# Patient Record
Sex: Female | Born: 1970 | Race: White | Hispanic: No | Smoking: Former smoker
Health system: Southern US, Community
[De-identification: ages and names within clinical notes are randomized; demographics above are authoritative.]

## PROBLEM LIST (undated history)

## (undated) DIAGNOSIS — F32A Depression, unspecified: Secondary | ICD-10-CM

## (undated) DIAGNOSIS — R0602 Shortness of breath: Secondary | ICD-10-CM

## (undated) DIAGNOSIS — J349 Unspecified disorder of nose and nasal sinuses: Secondary | ICD-10-CM

## (undated) DIAGNOSIS — J189 Pneumonia, unspecified organism: Secondary | ICD-10-CM

## (undated) DIAGNOSIS — R590 Localized enlarged lymph nodes: Secondary | ICD-10-CM

## (undated) DIAGNOSIS — G478 Other sleep disorders: Secondary | ICD-10-CM

## (undated) DIAGNOSIS — G4700 Insomnia, unspecified: Secondary | ICD-10-CM

## (undated) DIAGNOSIS — E785 Hyperlipidemia, unspecified: Secondary | ICD-10-CM

## (undated) DIAGNOSIS — N200 Calculus of kidney: Secondary | ICD-10-CM

## (undated) DIAGNOSIS — C801 Malignant (primary) neoplasm, unspecified: Secondary | ICD-10-CM

## (undated) DIAGNOSIS — R51 Headache: Secondary | ICD-10-CM

## (undated) DIAGNOSIS — E079 Disorder of thyroid, unspecified: Secondary | ICD-10-CM

## (undated) DIAGNOSIS — R599 Enlarged lymph nodes, unspecified: Secondary | ICD-10-CM

## (undated) DIAGNOSIS — G932 Benign intracranial hypertension: Secondary | ICD-10-CM

## (undated) DIAGNOSIS — J40 Bronchitis, not specified as acute or chronic: Secondary | ICD-10-CM

## (undated) DIAGNOSIS — R011 Cardiac murmur, unspecified: Secondary | ICD-10-CM

## (undated) DIAGNOSIS — R519 Headache, unspecified: Secondary | ICD-10-CM

## (undated) DIAGNOSIS — R0683 Snoring: Secondary | ICD-10-CM

## (undated) DIAGNOSIS — R351 Nocturia: Secondary | ICD-10-CM

## (undated) DIAGNOSIS — R0681 Apnea, not elsewhere classified: Secondary | ICD-10-CM

## (undated) DIAGNOSIS — F329 Major depressive disorder, single episode, unspecified: Secondary | ICD-10-CM

## (undated) HISTORY — DX: Bronchitis, not specified as acute or chronic: J40

## (undated) HISTORY — DX: Apnea, not elsewhere classified: R06.81

## (undated) HISTORY — PX: CHOLECYSTECTOMY: SHX55

## (undated) HISTORY — DX: Headache: R51

## (undated) HISTORY — DX: Insomnia, unspecified: G47.00

## (undated) HISTORY — PX: OTHER SURGICAL HISTORY: SHX169

## (undated) HISTORY — DX: Enlarged lymph nodes, unspecified: R59.9

## (undated) HISTORY — DX: Major depressive disorder, single episode, unspecified: F32.9

## (undated) HISTORY — DX: Pneumonia, unspecified organism: J18.9

## (undated) HISTORY — DX: Nocturia: R35.1

## (undated) HISTORY — DX: Snoring: R06.83

## (undated) HISTORY — DX: Disorder of thyroid, unspecified: E07.9

## (undated) HISTORY — DX: Benign intracranial hypertension: G93.2

## (undated) HISTORY — DX: Other sleep disorders: G47.8

## (undated) HISTORY — DX: Localized enlarged lymph nodes: R59.0

## (undated) HISTORY — DX: Morbid (severe) obesity due to excess calories: E66.01

## (undated) HISTORY — DX: Malignant (primary) neoplasm, unspecified: C80.1

## (undated) HISTORY — DX: Shortness of breath: R06.02

## (undated) HISTORY — DX: Depression, unspecified: F32.A

## (undated) HISTORY — DX: Headache, unspecified: R51.9

## (undated) HISTORY — DX: Cardiac murmur, unspecified: R01.1

## (undated) HISTORY — DX: Calculus of kidney: N20.0

## (undated) HISTORY — PX: TUBAL LIGATION: SHX77

## (undated) HISTORY — DX: Hyperlipidemia, unspecified: E78.5

## (undated) HISTORY — DX: Unspecified disorder of nose and nasal sinuses: J34.9

---

## 1996-04-11 HISTORY — PX: HERNIA REPAIR: SHX51

## 1997-09-17 ENCOUNTER — Other Ambulatory Visit: Admission: RE | Admit: 1997-09-17 | Discharge: 1997-09-17 | Payer: Self-pay | Admitting: Obstetrics and Gynecology

## 1997-10-14 ENCOUNTER — Other Ambulatory Visit: Admission: RE | Admit: 1997-10-14 | Discharge: 1997-10-14 | Payer: Self-pay | Admitting: Obstetrics and Gynecology

## 1998-05-18 ENCOUNTER — Other Ambulatory Visit: Admission: RE | Admit: 1998-05-18 | Discharge: 1998-05-18 | Payer: Self-pay | Admitting: Obstetrics and Gynecology

## 1998-07-03 ENCOUNTER — Other Ambulatory Visit: Admission: RE | Admit: 1998-07-03 | Discharge: 1998-07-03 | Payer: Self-pay | Admitting: Obstetrics and Gynecology

## 1998-07-24 ENCOUNTER — Emergency Department (HOSPITAL_COMMUNITY): Admission: EM | Admit: 1998-07-24 | Discharge: 1998-07-25 | Payer: Self-pay

## 1998-07-26 ENCOUNTER — Ambulatory Visit (HOSPITAL_COMMUNITY): Admission: RE | Admit: 1998-07-26 | Discharge: 1998-07-26 | Payer: Self-pay | Admitting: Emergency Medicine

## 1998-07-26 ENCOUNTER — Encounter: Payer: Self-pay | Admitting: Emergency Medicine

## 1998-08-07 ENCOUNTER — Observation Stay (HOSPITAL_COMMUNITY): Admission: RE | Admit: 1998-08-07 | Discharge: 1998-08-08 | Payer: Self-pay | Admitting: *Deleted

## 1998-08-07 ENCOUNTER — Encounter: Payer: Self-pay | Admitting: *Deleted

## 1998-12-11 ENCOUNTER — Other Ambulatory Visit: Admission: RE | Admit: 1998-12-11 | Discharge: 1998-12-11 | Payer: Self-pay | Admitting: Obstetrics and Gynecology

## 1999-05-03 ENCOUNTER — Other Ambulatory Visit: Admission: RE | Admit: 1999-05-03 | Discharge: 1999-05-03 | Payer: Self-pay | Admitting: Obstetrics and Gynecology

## 1999-08-25 ENCOUNTER — Other Ambulatory Visit: Admission: RE | Admit: 1999-08-25 | Discharge: 1999-08-25 | Payer: Self-pay | Admitting: Obstetrics and Gynecology

## 1999-08-25 ENCOUNTER — Encounter (INDEPENDENT_AMBULATORY_CARE_PROVIDER_SITE_OTHER): Payer: Self-pay

## 1999-10-20 ENCOUNTER — Ambulatory Visit: Admission: RE | Admit: 1999-10-20 | Discharge: 1999-10-20 | Payer: Self-pay | Admitting: Obstetrics and Gynecology

## 1999-12-07 ENCOUNTER — Encounter: Admission: RE | Admit: 1999-12-07 | Discharge: 2000-03-06 | Payer: Self-pay | Admitting: Obstetrics and Gynecology

## 2000-01-28 ENCOUNTER — Ambulatory Visit (HOSPITAL_COMMUNITY): Admission: RE | Admit: 2000-01-28 | Discharge: 2000-01-28 | Payer: Self-pay | Admitting: Obstetrics and Gynecology

## 2000-01-30 ENCOUNTER — Encounter (INDEPENDENT_AMBULATORY_CARE_PROVIDER_SITE_OTHER): Payer: Self-pay | Admitting: Specialist

## 2000-01-30 ENCOUNTER — Inpatient Hospital Stay (HOSPITAL_COMMUNITY): Admission: AD | Admit: 2000-01-30 | Discharge: 2000-02-04 | Payer: Self-pay | Admitting: Obstetrics and Gynecology

## 2000-02-02 ENCOUNTER — Encounter: Payer: Self-pay | Admitting: Obstetrics and Gynecology

## 2000-05-25 ENCOUNTER — Other Ambulatory Visit: Admission: RE | Admit: 2000-05-25 | Discharge: 2000-05-25 | Payer: Self-pay | Admitting: Obstetrics and Gynecology

## 2000-05-26 ENCOUNTER — Encounter (INDEPENDENT_AMBULATORY_CARE_PROVIDER_SITE_OTHER): Payer: Self-pay

## 2000-05-26 ENCOUNTER — Other Ambulatory Visit: Admission: RE | Admit: 2000-05-26 | Discharge: 2000-05-26 | Payer: Self-pay | Admitting: Obstetrics and Gynecology

## 2001-03-22 ENCOUNTER — Other Ambulatory Visit: Admission: RE | Admit: 2001-03-22 | Discharge: 2001-03-22 | Payer: Self-pay | Admitting: Obstetrics and Gynecology

## 2001-04-11 HISTORY — PX: THYROIDECTOMY: SHX17

## 2001-06-12 ENCOUNTER — Other Ambulatory Visit: Admission: RE | Admit: 2001-06-12 | Discharge: 2001-06-12 | Payer: Self-pay | Admitting: Radiology

## 2001-08-02 ENCOUNTER — Encounter: Payer: Self-pay | Admitting: Surgery

## 2001-08-06 ENCOUNTER — Encounter (INDEPENDENT_AMBULATORY_CARE_PROVIDER_SITE_OTHER): Payer: Self-pay | Admitting: Specialist

## 2001-08-06 ENCOUNTER — Observation Stay (HOSPITAL_COMMUNITY): Admission: RE | Admit: 2001-08-06 | Discharge: 2001-08-07 | Payer: Self-pay | Admitting: Surgery

## 2001-09-04 ENCOUNTER — Ambulatory Visit (HOSPITAL_COMMUNITY): Admission: RE | Admit: 2001-09-04 | Discharge: 2001-09-04 | Payer: Self-pay | Admitting: Endocrinology

## 2001-09-04 ENCOUNTER — Encounter: Payer: Self-pay | Admitting: Endocrinology

## 2001-09-14 ENCOUNTER — Ambulatory Visit (HOSPITAL_COMMUNITY): Admission: RE | Admit: 2001-09-14 | Discharge: 2001-09-14 | Payer: Self-pay | Admitting: Endocrinology

## 2001-09-14 ENCOUNTER — Encounter: Payer: Self-pay | Admitting: Endocrinology

## 2001-10-03 ENCOUNTER — Other Ambulatory Visit: Admission: RE | Admit: 2001-10-03 | Discharge: 2001-10-03 | Payer: Self-pay | Admitting: Obstetrics and Gynecology

## 2002-03-25 ENCOUNTER — Other Ambulatory Visit: Admission: RE | Admit: 2002-03-25 | Discharge: 2002-03-25 | Payer: Self-pay | Admitting: Obstetrics and Gynecology

## 2003-01-26 ENCOUNTER — Encounter: Payer: Self-pay | Admitting: Emergency Medicine

## 2003-01-26 ENCOUNTER — Emergency Department (HOSPITAL_COMMUNITY): Admission: EM | Admit: 2003-01-26 | Discharge: 2003-01-26 | Payer: Self-pay | Admitting: Emergency Medicine

## 2003-09-22 ENCOUNTER — Other Ambulatory Visit: Admission: RE | Admit: 2003-09-22 | Discharge: 2003-09-22 | Payer: Self-pay | Admitting: Obstetrics and Gynecology

## 2004-06-29 ENCOUNTER — Emergency Department (HOSPITAL_COMMUNITY): Admission: EM | Admit: 2004-06-29 | Discharge: 2004-06-29 | Payer: Self-pay | Admitting: Emergency Medicine

## 2004-06-29 ENCOUNTER — Encounter (INDEPENDENT_AMBULATORY_CARE_PROVIDER_SITE_OTHER): Payer: Self-pay | Admitting: Specialist

## 2004-06-29 ENCOUNTER — Ambulatory Visit (HOSPITAL_COMMUNITY): Admission: RE | Admit: 2004-06-29 | Discharge: 2004-06-29 | Payer: Self-pay | Admitting: Obstetrics and Gynecology

## 2004-10-21 ENCOUNTER — Other Ambulatory Visit: Admission: RE | Admit: 2004-10-21 | Discharge: 2004-10-21 | Payer: Self-pay | Admitting: Obstetrics and Gynecology

## 2005-05-20 ENCOUNTER — Ambulatory Visit (HOSPITAL_COMMUNITY): Admission: RE | Admit: 2005-05-20 | Discharge: 2005-05-20 | Payer: Self-pay | Admitting: Neurology

## 2005-05-25 ENCOUNTER — Encounter: Admission: RE | Admit: 2005-05-25 | Discharge: 2005-05-25 | Payer: Self-pay | Admitting: Neurology

## 2005-08-01 ENCOUNTER — Ambulatory Visit (HOSPITAL_COMMUNITY): Admission: RE | Admit: 2005-08-01 | Discharge: 2005-08-01 | Payer: Self-pay | Admitting: Neurology

## 2005-09-26 ENCOUNTER — Emergency Department (HOSPITAL_COMMUNITY): Admission: EM | Admit: 2005-09-26 | Discharge: 2005-09-27 | Payer: Self-pay | Admitting: Emergency Medicine

## 2005-10-07 ENCOUNTER — Encounter: Admission: RE | Admit: 2005-10-07 | Discharge: 2005-10-07 | Payer: Self-pay | Admitting: Obstetrics and Gynecology

## 2006-02-24 ENCOUNTER — Encounter (INDEPENDENT_AMBULATORY_CARE_PROVIDER_SITE_OTHER): Payer: Self-pay | Admitting: *Deleted

## 2006-02-24 ENCOUNTER — Ambulatory Visit (HOSPITAL_COMMUNITY): Admission: RE | Admit: 2006-02-24 | Discharge: 2006-02-24 | Payer: Self-pay | Admitting: Obstetrics and Gynecology

## 2006-02-24 ENCOUNTER — Encounter (INDEPENDENT_AMBULATORY_CARE_PROVIDER_SITE_OTHER): Payer: Self-pay | Admitting: Specialist

## 2008-09-22 ENCOUNTER — Encounter (HOSPITAL_COMMUNITY): Admission: RE | Admit: 2008-09-22 | Discharge: 2008-12-21 | Payer: Self-pay | Admitting: Surgery

## 2008-10-01 ENCOUNTER — Encounter: Admission: RE | Admit: 2008-10-01 | Discharge: 2008-10-01 | Payer: Self-pay | Admitting: Surgery

## 2010-01-04 ENCOUNTER — Encounter (HOSPITAL_COMMUNITY)
Admission: RE | Admit: 2010-01-04 | Discharge: 2010-04-04 | Payer: Self-pay | Source: Home / Self Care | Attending: Endocrinology | Admitting: Endocrinology

## 2010-05-02 ENCOUNTER — Encounter: Payer: Self-pay | Admitting: Neurology

## 2010-05-03 ENCOUNTER — Encounter: Payer: Self-pay | Admitting: Surgery

## 2010-06-24 LAB — THYROGLOBULIN LEVEL: Thyroglobulin: 0.4 ng/mL (ref 0.0–55.0)

## 2010-07-12 ENCOUNTER — Other Ambulatory Visit: Payer: Self-pay | Admitting: Obstetrics and Gynecology

## 2010-07-12 DIAGNOSIS — N649 Disorder of breast, unspecified: Secondary | ICD-10-CM

## 2010-07-14 ENCOUNTER — Other Ambulatory Visit: Payer: Self-pay

## 2010-07-19 LAB — THYROGLOBULIN LEVEL: Thyroglobulin: 0.9 ng/mL — ABNORMAL LOW (ref 2.0–35.0)

## 2010-07-27 ENCOUNTER — Ambulatory Visit
Admission: RE | Admit: 2010-07-27 | Discharge: 2010-07-27 | Disposition: A | Discharge status: Home / Self Care | Payer: 59 | Source: Ambulatory Visit | Attending: Obstetrics and Gynecology | Admitting: Obstetrics and Gynecology

## 2010-07-27 DIAGNOSIS — N649 Disorder of breast, unspecified: Secondary | ICD-10-CM

## 2010-08-16 ENCOUNTER — Ambulatory Visit
Admission: RE | Admit: 2010-08-16 | Discharge: 2010-08-16 | Disposition: A | Discharge status: Home / Self Care | Payer: 59 | Source: Ambulatory Visit | Attending: Surgery | Admitting: Surgery

## 2010-08-16 ENCOUNTER — Other Ambulatory Visit: Payer: Self-pay | Admitting: Surgery

## 2010-08-16 DIAGNOSIS — R1031 Right lower quadrant pain: Secondary | ICD-10-CM

## 2010-08-16 MED ORDER — IOHEXOL 300 MG/ML  SOLN
125.0000 mL | Freq: Once | INTRAMUSCULAR | Status: AC | PRN
  Administered 2010-08-16: 125 mL via INTRAVENOUS

## 2010-08-25 ENCOUNTER — Other Ambulatory Visit: Payer: Self-pay | Admitting: Obstetrics and Gynecology

## 2010-08-27 ENCOUNTER — Other Ambulatory Visit (INDEPENDENT_AMBULATORY_CARE_PROVIDER_SITE_OTHER): Payer: Self-pay | Admitting: Surgery

## 2010-08-27 ENCOUNTER — Ambulatory Visit (HOSPITAL_BASED_OUTPATIENT_CLINIC_OR_DEPARTMENT_OTHER)
Admission: RE | Admit: 2010-08-27 | Discharge: 2010-08-27 | Disposition: A | Discharge status: Home / Self Care | Payer: 59 | Source: Ambulatory Visit | Attending: Surgery | Admitting: Surgery

## 2010-08-27 DIAGNOSIS — R599 Enlarged lymph nodes, unspecified: Secondary | ICD-10-CM | POA: Insufficient documentation

## 2010-08-27 DIAGNOSIS — E669 Obesity, unspecified: Secondary | ICD-10-CM | POA: Insufficient documentation

## 2010-08-27 DIAGNOSIS — F17200 Nicotine dependence, unspecified, uncomplicated: Secondary | ICD-10-CM | POA: Insufficient documentation

## 2010-08-27 DIAGNOSIS — J45909 Unspecified asthma, uncomplicated: Secondary | ICD-10-CM | POA: Insufficient documentation

## 2010-08-27 DIAGNOSIS — G43909 Migraine, unspecified, not intractable, without status migrainosus: Secondary | ICD-10-CM | POA: Insufficient documentation

## 2010-08-27 DIAGNOSIS — F341 Dysthymic disorder: Secondary | ICD-10-CM | POA: Insufficient documentation

## 2010-08-27 DIAGNOSIS — Z01812 Encounter for preprocedural laboratory examination: Secondary | ICD-10-CM | POA: Insufficient documentation

## 2010-08-27 DIAGNOSIS — Z85850 Personal history of malignant neoplasm of thyroid: Secondary | ICD-10-CM | POA: Insufficient documentation

## 2010-08-27 LAB — POCT HEMOGLOBIN-HEMACUE: Hemoglobin: 14.6 g/dL (ref 12.0–15.0)

## 2010-08-27 NOTE — Op Note (Signed)
[facility]  Patient:    Michelle Green, Michelle Green                      MRN: [id number] Proc. Date: 01/30/00 Adm. Date:  [id number] Attending:  Trevor Iha                           Operative Report  PREOPERATIVE DIAGNOSES:       1. Previous cesarean section for repeat                                  cesarean section.                               2. Mature amniocentesis.                               3. Gestational diabetes requiring insulin.                               4. Labor with rupture of membranes and meconium                                  stained fluid.  POSTOPERATIVE DIAGNOSES:      1. Previous cesarean section for repeat                                  cesarean section.                               2. Mature amniocentesis.                               3. Gestational diabetes requiring insulin.                               4. Labor with rupture of membranes and meconium                                  stained fluid.  OPERATION:                    Repeat low segment transverse cesarean section.  SURGEON:                      Trevor Iha, M.D.  ASSISTANT:  ANESTHESIA:                   Spinal anesthesia.  ESTIMATED BLOOD LOSS:         800 cc.  INDICATIONS:                  Ms. Michelle Green is a 40 year old G2, P1 at 37-1/2 weeks who had an amniocentesis two days ago which showed fetal lung maturity. She is a gestational diabetic with good control requiring insulin.  She presents in active labor.  Cervix 6 cm and gross rupture of fluid.  Meconium stained fluid.  Previous cesarean section for repeat.  PLAN:                         Repeat cesarean section.  Risks and benefits were discussed and informed consent was obtained.  FINDINGS:                     A viable female infant with Apgars of 9 and 9, meconium fluid, none below the vocal cords per anesthesia.  The pH arterial was 7.29.  DESCRIPTION OF PROCEDURE:     After  adequate analgesia, the patient was placed in the supine position with left lateral tilt.  She was sterilely prepped and draped.  Foley catheter was sterilely in place and a Pfannenstiel skin incision was made two fingerbreadth above the pubic symphysis.  This was taken down sharply to the fascia which was incised transversely and extended superior and inferiorly off the bellies of the rectus muscle.  The peritoneum was entered sharply. Bladder blade was placed. The uterine serosa was elevated and nicked and incised transversely.  Bladder flap was created and placed behind the bladder blade.  A low segment myotomy incision was made down to the infants vertex.  It was extended laterally with the operators fingertips. The infants vertex was delivered atraumatically.  Nares and pharynx were then DeLee suctioned, systemically suctioned with minimal meconium fluid return. Nuchal cord x 1 was reduced.  The infant was delivered.  Cord was clamped and the infant was handed to the pediatrician.  Cord blood was then obtained. Placenta was extracted manually.  Uterus was exteriorized and wiped clean with a dry lap.  The myotomy incision was closed in one layer with a running locking layer of 0 Monocryl. Good hemostasis was achieved.  Normal uterus, tubes and ovaries were noted.  The uterus was placed back into the peritoneal cavity after copious amount of irrigation.  Adequate hemostasis was assured. The peritoneum was closed with 0 Monocryl and rectus muscles plicated in the midline.  Irrigation was applied and after adequate hemostasis, the fascia was closed with 0 Panacryl with good approximation.  Irrigation of skin applied and after adequate hemostasis, skin was stapled and Steri-Strips were applied. The patient received 1 g of Cefotetan after delivery of the placenta.  Sponge, needle and instrument counts were normal x 3.  The estimated blood loss was 800 cc. DD:  01/30/00 TD:  01/31/00 Job:  [id number]

## 2010-08-27 NOTE — H&P (Signed)
NAME:  Michelle Green, Michelle Green               ACCOUNT NO.:  [id number]   MEDICAL RECORD NO.:  [id number]          PATIENT TYPE:  AMB   LOCATION:  SDC                           FACILITY:  [facility]   PHYSICIAN:  Guy Sandifer. Henderson Cloud, M.D. DATE OF BIRTH:  1970-05-15   DATE OF ADMISSION:  02/24/2006  DATE OF DISCHARGE:                                HISTORY & PHYSICAL   CHIEF COMPLAINT:  Pelvic pain and irregular menses.   HISTORY OF PRESENT ILLNESS:  This patient is a 40 year old married white  female, G2, P2, status post tubal ligation, endometrial ablation, and  ablation of endometriosis in March, 2006.  She has had a return of right  lower quadrant pain, especially with her menses becoming increasingly worse.  After discussion of the options, she is being admitted for laparoscopy,  hysteroscopy, D&C.   PAST MEDICAL HISTORY:  1. Hyperlipidemia.  2. Asthma.  3. Hypothyroidism.  4. Depression.  5. Pseudotumor cerebri.   PAST SURGICAL HISTORY:  1. Laparoscopic cholecystectomy.  2. Hernia rupture in a Pfannenstiel incision.  3. Thyroidectomy.  4. Laparoscopy, hysteroscopy, D&C, ablation, tubal ligation, as above.  5. Lumbar puncture x2.   OBSTETRIC HISTORY:  Cesarean section x2.   SOCIAL HISTORY:  Consumes alcohol on a social basis.  Denies drug or tobacco  abuse.   FAMILY HISTORY:  Unknown.   MEDICATIONS:  1. Topamax 50 mg a day x2.  2. Synthroid 112 mcg a day x2.  3. Advair 100/50 a day x2.  4. Tricor 145 mcg a day.  5. Omnicor 4 a day.   ALLERGIES:  PENICILLIN, leading to hives.   REVIEW OF SYSTEMS:  Pseudotumor cerebri, as above.  CARDIAC:  No chest pain.  PULMONARY: Denies shortness of breath.  GI:  Denies recent change in bowel  habits.   PHYSICAL EXAMINATION:  VITAL SIGNS:  Height 5 feet 4 inches.  Weight 219  pounds.  Blood pressure 122/70.  HEENT:  Without thyromegaly.  LUNGS:  Clear to auscultation.  HEART:  Regular rate and rhythm.  BACK:  Without CVA tenderness.  BREASTS:  Without mass, __________, or discharge.  ABDOMEN:  Soft and nontender without masses.  PELVIC:  Vulva, vagina, and cervix without lesion.  Uterus normal in size,  mobile, nontender.  Adnexa nontender without palpable masses.  EXTREMITIES:  Grossly within normal limits.  NEUROLOGIC:  Grossly within normal limits.   ASSESSMENT:  1. Pelvic pain.  2. Irregular menses.   PLAN:  Laparoscopy, hysteroscopy, D&C.      Guy Sandifer Henderson Cloud, M.D.  Electronically Signed     JET/MEDQ  D:  02/22/2006  T:  02/22/2006  Job:  [id number]

## 2010-08-27 NOTE — Discharge Summary (Signed)
[facility]  Patient:    Michelle Green, Michelle Green                      MRN: [id number] Adm. Date:  [id number] Disc. Date: [id number] Attending:  Trevor Iha Dictator:   Danie Chandler, R.N.                           Discharge Summary  ADMISSION DIAGNOSES:          1. Previous cesarean section for repeat                                  cesarean section.                               2. Mature amniocentesis.                               3. Gestational diabetes requiring insulin.                               4. Labor with rupture of membranes and meconium                                  stained fluid.  DISCHARGE DIAGNOSES:          1. Previous cesarean section for repeat                                  cesarean section.                               2. Mature amniocentesis.                               3. Gestational diabetes requiring insulin.                               4. Labor with rupture of membranes and meconium                                  stained fluid.  PROCEDURE:                    On January 30, 2000, repeat low segment transverse cesarean section was performed.  REASON FOR ADMISSION:         Please see dictated H&P.  HOSPITAL COURSE:              The patient was taken to the operating room and underwent the above named procedure without complications.  This was productive of a viable female infant with Apgars of 9 at one minute and 9 at five minutes.  An arterial cord pH was 7.29.  Postoperatively, on day #1, the patient had good control of pain and good return of bowel function.  She was  tolerating a regular diet. Her hemoglobin was 10.0, hematocrit 28.5 and white blood cell count 14.1.  On postoperative day #2, the patients blood pressures were 132/82 and 136/74.  She was without headache, visual change or right upper quadrant pain.  On postoperative day #3, the patient was complaining of some chest pain and upper back pain  when breathing in.  She denied headache, visual change or right upper quadrant pain.  Her capillary blood glucoses were good.  Blood pressures were 130s to 140s/70s to 80s.  Deep tendon reflexes were 1 to 2+ with no clonus.  The patient had 3+ pedal edema with edema to thighs.  Her lungs were clear bilaterally.  The patient had a chest x-ray, PA and lateral, performed on this day as well as PIH labs with CBC and a straight catheterized UA for protein.  Labs did return showing SGOT of 79 and SGPT of 85.  Due to the patients increased blood pressure and history of PIH with the last pregnancy, the patient was started on magnesium sulfate and on postoperative day #4, the patient continued to deny headache, visual changes, or right upper quadrant pain.  Her blood pressure was 135/70, her vital signs were stable.  Her SGOT was 77, SGPT was 74, LDH was okay, her platelets were okay.  Magnesium sulfate was discontinued and she was discharged home on postoperative day #5 in stable condition.  CONDITION ON DISCHARGE:       Good.  DIET:                         Regular as tolerated.  ACTIVITY:                     No heavy lifting, no driving, no vaginal entry.  FOLLOW-UP:                    She is to follow up in the office on Monday, and she is to call for temperature greater than 100 degrees, persistent nausea or vomiting, heavy vaginal bleeding and/or redness or drainage from the incision site as well as any PIH signs or symptoms.  DISCHARGE MEDICATIONS:        1. Prenatal vitamin one p.o. q.d.                               2. Tylox one to two p.o. q.4-6h. p.r.n. pain. DD:  03/20/00 TD:  03/20/00 Job: [id number]

## 2010-08-27 NOTE — Op Note (Signed)
NAME:  Michelle Green, Michelle Green               ACCOUNT NO.:  [id number]   MEDICAL RECORD NO.:  [id number]          PATIENT TYPE:  AMB   LOCATION:  SDC                           FACILITY:  [facility]   PHYSICIAN:  Guy Sandifer. Tomblin II, M.D.DATE OF BIRTH:  [birth year]   DATE OF PROCEDURE:  06/29/2004  DATE OF DISCHARGE:                                 OPERATIVE REPORT   PREOPERATIVE DIAGNOSES:  1.  Dysmenorrhea.  2.  Menorrhagia.   POSTOPERATIVE DIAGNOSES:  1.  Menorrhagia.  2.  Endometriosis.   PROCEDURE:  Hysteroscopy, dilatation and curettage, NovaSure endometrial  ablation, open laparoscopy, bilateral tubal ligation with Filshie clips, and  1% Xylocaine paracervical block.   SURGEON:  Guy Sandifer. Henderson Cloud, M.D.   ANESTHESIA:  General with endotracheal intubation.   SPECIMENS:  Endometrial curettings.   ESTIMATED BLOOD LOSS:  Less than 50 cc.   IN'S AND OUT'S:  Sorbitol distending media, 50 cc deficit.   INDICATIONS AND CONSENT:  This patient is a 40 year old married white  female, G2, P2, with increasingly heavy menses and pelvic pain.  Details are  dictated in the history and physical.  Laparoscopy with tubal ligation with  Filshie clips, hysteroscopy, D&C, and NovaSure endometrial ablation has been  discussed with the patient preoperatively.  The potential risks and  complications have been discussed preoperatively, including but not limited  to infection, uterine perforation, bowel, bladder, or ureteral damage;  bleeding requiring transfusion of blood products with possible transfusion  reaction, HIV and hepatitis acquisition; DVT, PE, and pneumonia;  hysterectomy, laparotomy, recurrent pelvic pain, and/or heavy bleeding and  dyspareunia.  All questions have been answered, and consent is signed on the  chart.   FINDINGS:  The internal uterine cavity is without abnormal structure.  Abdominally, there is an omental adhesion immediately superior to the  umbilicus status post laparoscopic  cholecystectomy.  There are no closed  loops involved with this.  In the pelvis, the uterus is retroverted, 6 weeks  in size.  There are some dense adhesions on the anterior lower uterine  segment status post cesarean section.  There is a single power burn-type  implant of endometriosis in the center of the vesicouterine peritoneum.  The  ovaries appear normal bilaterally.  The tubes are normal.  The posterior cul-  de-sac is normal.   DESCRIPTION OF PROCEDURE:  The patient is taken to the operating room where  she is identified and placed in the dorsal supine position.  General  anesthesia is induced via endotracheal intubation.  She is then placed in  the dorsal lithotomy position and prepped abdominally and vaginally.  The  bladder is straight catheterized, and she is draped in a sterile fashion.   Examination reveals the uterus to be retroverted.  A bivalve speculum is  placed in the vagina, and the anterior cervical lip is injected with 1%  Xylocaine and grasped with a single-tooth tenaculum.  A paracervical block  is placed in the 2, 4, 5, 7, 8, and 10 o'clock positions, with approximately  20 cc total of 1% Xylocaine.  The uterus sounds to 9 cm,  and the cervix  sounds to 4 cm.  The cervix is then gently progressively dilated to a 27  Pratt dilator.  The diagnostic hysteroscope is placed in the endocervical  canal and advanced under direct visualization using sorbitol distending  media.  The patient is on her menses, making it difficult to visualize the  entire cavity.  The hysteroscope is withdrawn, and sharp curettage is  carried out.  The hysteroscope is then once again advanced under direct  visualization into the endometrial cavity, and inspection reveals the cavity  to be normal.  The hysteroscope is then removed.   The NovaSure endometrial ablator is placed in the cavity and positioned per  manufacturer's instructions.  The cavity measures 3.7 cm.  The cavity test  is good  on the first attempt.  Endometrial ablation is then carried out  without difficulty.  The NovaSure endometrial ablator is then removed, and  inspection reveals the tip to the intact.  The Hulka tenaculum is used to  replace the single-tooth tenaculum, and attention is turned to the abdomen.   An infraumbilical incision is made in the midline over her previous scar.  Dissection is carried out in layers to the anterior fascia which is incised.  An anchoring suture of 0 Vicryl is placed on each angle of the anterior  fascia.  Continued dissection is carried out through the posterior fascia  and into the peritoneal cavity without difficulty.  A disposable open trocar  sleeve is then placed and tied down with anchoring sutures.  Inspection  reveals the above findings.  The single implant of endometriosis is ablated  with bipolar cautery.  The right fallopian tube is identified from cornua to  fimbria.  A Filshie clamp is placed on the proximal one-third of the tube.  A similar procedure is carried out on the left tube.  The Filshie clip  applicator is then removed, and careful inspection reveals the entire width  of the tube to be within the clip and the heel of the clip to be visualized  through the mesosalpinx bilaterally.  There appeared to be an approximately  1 to 2-cm cyst on the right ovary.  An attempt at aspiration of this  revealed there was in essence no fluid.  The area was cauterized briefly  with bipolar cautery to assure hemostasis.  The suprapubic trocar sleeve is  removed.  Good hemostasis is noted all around.  The umbilical trocar sleeve  is removed, reducing the pneumoperitoneum.  The angle sutures are tied in  the midline to close the fascia.  Care is taken to elevate the anterior  abdominal wall while doing this to avoid any underlying structures.  Both  incisions are then injected with 0.5% plain Marcaine.  The umbilical incision is closed in a subcuticular manner with a  3-0 Vicryl suture.  Dermabond is placed over both incisions, and a pressure dressing is placed  on the umbilical incision.  The Hulka tenaculum is removed, and no bleeding  is noted.  All counts are correct.   The patient is awakened and taken to the recovery room in stable condition.      JET/MEDQ  D:  06/29/2004  T:  06/29/2004  Job:  [id number]

## 2010-08-27 NOTE — Op Note (Signed)
NAME:  Michelle Green, Michelle Green               ACCOUNT NO.:  [id number]   MEDICAL RECORD NO.:  [id number]          PATIENT TYPE:  AMB   LOCATION:  SDC                           FACILITY:  [facility]   PHYSICIAN:  Guy Sandifer. Henderson Cloud, M.D. DATE OF BIRTH:  1971-03-19   DATE OF PROCEDURE:  02/24/2006  DATE OF DISCHARGE:                                 OPERATIVE REPORT   PREOPERATIVE DIAGNOSIS:  1. Pelvic pain.  2. Irregular menses.   POSTOPERATIVE DIAGNOSIS:  1. Endometriosis.  2. Right ovarian cyst.   PROCEDURES:  1. Laparoscopy with ablation of endometriosis.  2. Aspiration of right ovarian cyst.  3. Hysteroscopy.  4. Dilatation curettage with 1% lidocaine paracervical block.   SURGEON:  Guy Sandifer. Henderson Cloud, M.D.   ANESTHESIA:  General with endotracheal intubation.   SPECIMENS:  Endometrial curettings and aspirated right ovarian cyst--both to  pathology.   ESTIMATED BLOOD LOSS:  Drops.   INDICATIONS AND CONSENT:  This patient is a 40 year old married white female  G2, P2, status post tubal ligation endometrial ablation with the increasing  pelvic pain and irregular menses.  Details are dictated in the history and  physical.  Laparoscopy, hysteroscopy, and D&C have been discussed  preoperatively.  Potential risks and complications have been discussed  preoperatively; including but limited to infection, bowel, bladder, ureteral  damage, bleeding requiring transfusion of blood products, possible  transfusion reaction, HIV and hepatitis acquisition, DVT, PE, pneumonia,  uterine perforation, laparotomy, recurrent pain or abnormal bleeding.  All  questions have been answered and consent is signed and on the chart.   FINDINGS:  Endometrial cavity is shallow.  It sounds to 4 cm or less.  It  appears agglutinated secondary to ablation.  Abdominally, upper abdomen  appears normal.  Uterus is normal in contour.  There is a single powder burn  type lesion on the vesicouterine peritoneum anteriorly; and a  single 1 brown  and black lesion in the posterior cul-de-sac.  Left ovary is normal.  Right  ovary contains 2 cm translucent cyst.  Tubes are status post ablation with  Filshie clips bilaterally.   DESCRIPTION OF PROCEDURE:  The patient is taken to operating room where she  is identified, placed in the dorsal supine position, and general anesthesia  is induced via endotracheal intubation.  She is then placed in the dorsal  lithotomy position, prepped abdominally and vaginally.  Bladder straight  catheterized, and draped in a sterile fashion.  A bivalve speculum is placed  in vagina.  The anterior cervical lip is injected with 1% Xylocaine and  grasped with a single-tooth tenaculum.  Paracervical block was placed at 2,  4, 5, 7, 8, and 10 o'clock positions with approximately 20 mL total of 1%  plain Xylocaine.  Attempts at sounding, and dilating the cervix are  unsuccessful; and the uterus sounds to approximately 3-4 cm.   Therefore, the diagnostic hysteroscope is placed and advanced under direct  visualization using sorbitol distending media.  The above findings are  noted.  Hysteroscope is withdrawn; and sharp curettage is carried out for  scant tissue.  The endometrial  cavity is not long enough to accommodate a  Hulka tenaculum.  Therefore, the single-tooth tenaculum is removed.  All  instruments are removed from the vagina; and attention is turned to the  abdomen.  The infraumbilical and suprapubic areas are injected in the  midline with 1/2% plain Marcaine.   A small infraumbilical incision is made and a Veress needle was placed  without difficulty.  Normal syringe and drop test is noted.  2 liters of gas  were insufflated under low pressure with good tympany in the right upper  quadrant.  Veress needle is removed.  A 10/11 XL blade with a disposable  trocar sleeve was then placed using direct visualization with the diagnostic  laparoscope.  A small suprapubic incision is made in  the midline; and a 5-mm  bladeless XL disposable trocar sleeve was placed under direct visualization  without difficulty.  The above findings were noted.  The right ovary is  aspirated for a small amount of serous fluid.  The endometrial implants  anteriorly and posteriorly are cauterized with bipolar cautery.   Suprapubic trocar sleeve is removed.  The pneumoperitoneum is reduced.  The  umbilical trocar sleeve is removed.  The umbilical incision is closed with 2-  0 Vicryl in subcutaneous tissues with care being taken not to pick up any  underlying structure.  The skin of the umbilical incision is closed with  interrupted 2-0 silk suture.  The suprapubic incision is closed with  Dermabond.  All counts correct.  The patient is awakened, taken to recovery  room in stable condition.      Guy Sandifer Henderson Cloud, M.D.  Electronically Signed     JET/MEDQ  D:  02/24/2006  T:  02/24/2006  Job:  [id number]

## 2010-08-27 NOTE — H&P (Signed)
[facility]  Patient:    Michelle Green, Michelle Green                      MRN: [id number] Adm. Date:  [id number] Attending:  Trevor Iha                         History and Physical  HISTORY OF PRESENT ILLNESS:   Ms. Farino is a 40 year old G 2, P 1, at 37-1/2 weeks, who presents in active labor with contractions every two to three minutes.  The cervix is 6.0 cm dilated, and ruptured membranes approximately one hour ago, with meconium-stained fluid.  The pregnancy was complicated by gestational diabetes requiring insulin, with good control.  The estimated date of confinement is February 18, 2000.  She underwent an amniocentesis for fetal immaturity two days ago which showed an L:S ratio of 6:1 with PG.  She presents for a repeat cesarean section.  History of a previous cesarean section of a 10 pound infant.  PHYSICAL EXAMINATION:  VITAL SIGNS:                  Blood pressure 138/70, weight 258 pounds.  HEART:                        A regular rate and rhythm.  LUNGS:                        Clear to auscultation bilaterally.  ABDOMEN:                      Gravid, nontender.  PELVIC:                       Cervix is 6, complete, -3, gross rupture.  IMPRESSION/PLAN:              1. Previous cesarean section, for a repeat                                  cesarean section.                               2. Gestational diabetes requiring insulin,                                  with good control.                               3. Possible labor, with meconium-stained                                  fluid and _____________  PLAN:                         Repeat low transverse cesarean section.  The risks and benefits were discussed and an informed consent was obtained. DD:  01/30/00 TD:  01/30/00 Job: [id number]

## 2010-08-27 NOTE — H&P (Signed)
NAME:  Michelle Green, Michelle Green               ACCOUNT NO.:  [id number]   MEDICAL RECORD NO.:  [id number]          PATIENT TYPE:  AMB   LOCATION:  SDC                           FACILITY:  [facility]   PHYSICIAN:  Guy Sandifer. Tomblin II, M.D.DATE OF BIRTH:  June 07, 1970   DATE OF ADMISSION:  06/29/2004  DATE OF DISCHARGE:                                HISTORY & PHYSICAL   CHIEF COMPLAINT:  Heavy menses.   HISTORY OF PRESENT ILLNESS:  The patient is a 40 year old married white  female in G2, P2 who has regular menses.  However, they have become  increasingly heavy each month, and they are also associated with cramping.  After discussing the options, the patient is being admitted for  hysteroscopy, D&C, Novasure, endometrial ablation and laparoscopy with  application of Filshie clips.  Potential risks and complications have been  discussed with the patient preoperatively.   PAST MEDICAL HISTORY:  1.  Hyperlipidemia.  2.  Asthma.  3.  Hypothyroidism.  4.  Depression.   PAST SURGICAL HISTORY:  1.  Laparoscopic cholecystectomy.  2.  Hernia repair in a Pfannenstiel incision.  3.  Thyroidectomy.   OBSTETRIC HISTORY:  Cesarean section x2.   SOCIAL HISTORY:  She consumes alcohol on a social basis.  Denies drug or  tobacco abuse.   MEDICATIONS:  1.  Tri-Chlor 145 mg daily.  2.  Cymbalta 60 mg daily.  3.  Advair daily.  4.  Synthroid 0.112 mg x2 daily.   ALLERGIES:  PENICILLIN LEADING TO HIVES.   FAMILY HISTORY:  Unknown.   REVIEW OF SYSTEMS:  NEUROLOGICAL:  Denies headaches.  CARDIOVASCULAR:  Denies chest pain.  PULMONARY:  History of asthma as above.  GI:  Denies  recent changes in bowel habits.   PHYSICAL EXAMINATION:  VITAL SIGNS:  Height 5 feet 4 inches, weight 225  pounds, blood pressure 110/70.  HEENT:  Without thyromegaly.  LUNGS:  Clear to auscultation.  HEART:  Regular rate and rhythm.  BACK:  Without CVA tenderness.  BREAST:  Without mass, exudate or discharge.  ABDOMEN:  Soft,  nontender without masses.  PELVIC:  Pelvis, vagina, cervix without lesion.  Uterus normal size, mobile,  nontender.  Adnexa nontender without masses.  EXTREMITIES:  Neurological exam grossly within normal limits.   ASSESSMENT:  Dysmenorrhea, menorrhagia.   PLAN:  Laparoscopy, tubal ligation with Filshie clips, hysteroscopy, D&C,  Novasure, endometrial ablation.      JET/MEDQ  D:  06/23/2004  T:  06/23/2004  Job:  [id number]

## 2010-09-10 NOTE — Op Note (Signed)
  NAMEPARA, COSSEY               ACCOUNT NO.:  [id number]  MEDICAL RECORD NO.:  [id number]           PATIENT TYPE:  LOCATION:                                 FACILITY:  PHYSICIAN:  Velora Heckler, MD           DATE OF BIRTH:  DATE OF PROCEDURE:  08/27/2010                               OPERATIVE REPORT   PREOPERATIVE DIAGNOSIS:  Left posterior cervical lymphadenopathy, history of thyroid carcinoma.  POSTOPERATIVE DIAGNOSIS:  Left posterior cervical lymphadenopathy, history of thyroid carcinoma.  PROCEDURE:  Left posterior cervical lymph node excisional biopsy.  SURGEON:  Velora Heckler, MD, FACS  ANESTHESIA:  General.  ESTIMATED BLOOD LOSS:  Minimal.  PREPARATION:  ChloraPrep.  COMPLICATIONS:  None.  INDICATIONS:  The patient is a 40 year old white female well-known to my surgical practice.  The patient had had a previous thyroidectomy for thyroid cancer.  A total body iodine scan in the fall of 2011 was negative for any sign of metastatic disease.  However, the left posterior cervical lymph node has persisted and remained palpable.  The patient desires surgical excision for definitive diagnosis.  She now comes to the operating room.  BODY OF REPORT:  Procedure was done in OR #8 at the [facility] surgery center.  The patient was brought to the operating room, placed in supine position on the operating room table.  Following administration of general anesthesia, the patient was turned to a right lateral decubitus position and padded in the appropriate fashion.  Small amount of hair at the hairline on the left posterior neck was shaved.  The patient was then prepped with a ChloraPrep.  Sterile drapes were applied.  Using a #15 blade, a 2-cm incision was made over the palpable lymph node.  Dissection was carried into subcutaneous tissues.  Subcutaneous tissue immediately inferior to the incision was excised.  Lymph node was identified visually.  It was excised in its  entirety.  It measures a little over 1-cm in maximum dimension.  It was relatively firm.  It was submitted in its entirety to pathology for review.  Good hemostasis was achieved with electrocautery.  Wound was irrigated with saline.  Surgicel was placed in the operative field.  Incision was anesthetized with local Marcaine anesthetic.  Skin edges were reapproximated with interrupted 4-0 Monocryl subcuticular sutures.  Wound was washed and dried and Dermabond was applied as dressing.  The patient was awakened from anesthesia and brought to the recovery room.  The patient tolerated the procedure well.   Velora Heckler, MD, FACS     TMG/MEDQ  D:  08/27/2010  T:  08/27/2010  Job:  [id number]  cc:   Jeannett Senior A. Evlyn Kanner, M.D. Guy Sandifer Henderson Cloud, M.D. Deirdre Peer. Polite, M.D.  Electronically Signed by Darnell Level MD on 09/10/2010 09:07:03 AM

## 2010-09-29 ENCOUNTER — Encounter (INDEPENDENT_AMBULATORY_CARE_PROVIDER_SITE_OTHER): Payer: Self-pay | Admitting: Surgery

## 2010-09-29 DIAGNOSIS — E079 Disorder of thyroid, unspecified: Secondary | ICD-10-CM

## 2010-09-29 DIAGNOSIS — J45909 Unspecified asthma, uncomplicated: Secondary | ICD-10-CM | POA: Insufficient documentation

## 2010-09-29 DIAGNOSIS — J189 Pneumonia, unspecified organism: Secondary | ICD-10-CM

## 2010-09-29 DIAGNOSIS — R5383 Other fatigue: Secondary | ICD-10-CM

## 2010-09-29 DIAGNOSIS — R0602 Shortness of breath: Secondary | ICD-10-CM

## 2010-09-29 DIAGNOSIS — R599 Enlarged lymph nodes, unspecified: Secondary | ICD-10-CM

## 2010-09-29 DIAGNOSIS — K469 Unspecified abdominal hernia without obstruction or gangrene: Secondary | ICD-10-CM

## 2010-09-29 DIAGNOSIS — J40 Bronchitis, not specified as acute or chronic: Secondary | ICD-10-CM

## 2010-09-29 DIAGNOSIS — J349 Unspecified disorder of nose and nasal sinuses: Secondary | ICD-10-CM

## 2010-09-29 DIAGNOSIS — R519 Headache, unspecified: Secondary | ICD-10-CM

## 2010-10-05 ENCOUNTER — Encounter (INDEPENDENT_AMBULATORY_CARE_PROVIDER_SITE_OTHER): Payer: Self-pay | Admitting: Surgery

## 2010-10-05 ENCOUNTER — Ambulatory Visit (INDEPENDENT_AMBULATORY_CARE_PROVIDER_SITE_OTHER): Payer: 59 | Admitting: Surgery

## 2010-10-05 VITALS — BP 135/80 | HR 86 | Temp 98.9°F | Wt 225.0 lb

## 2010-10-05 DIAGNOSIS — R599 Enlarged lymph nodes, unspecified: Secondary | ICD-10-CM

## 2010-10-05 DIAGNOSIS — G8929 Other chronic pain: Secondary | ICD-10-CM | POA: Insufficient documentation

## 2010-10-05 DIAGNOSIS — R1031 Right lower quadrant pain: Secondary | ICD-10-CM

## 2010-10-05 NOTE — Progress Notes (Signed)
Postop visit: Patient returns for followup of left posterior cervical lymph node biopsy. Final pathology was benign. She returns today for wound check.  Exam: Left posterior cervical incision has healed nicely. No seroma. No sign of infection. No significant tenderness. Palpation of right lower quadrant abdominal wall and groin area shows no sign of hernia.  Impression: #1-status post left posterior cervical lymph node biopsy with benign pathology, wound healing without complication #2-persistent right groin pain and distribution of the ilioinguinal nerve were relieved with anti-inflammatory medications.  Plan: Patient will return at a time convenient for her for an ilioinguinal nerve block to see if this provides symptomatic relief in the area of the right groin.

## 2010-10-10 DIAGNOSIS — N200 Calculus of kidney: Secondary | ICD-10-CM

## 2010-10-10 HISTORY — DX: Calculus of kidney: N20.0

## 2010-11-05 ENCOUNTER — Encounter (INDEPENDENT_AMBULATORY_CARE_PROVIDER_SITE_OTHER): Payer: Self-pay | Admitting: Surgery

## 2010-11-08 ENCOUNTER — Encounter (INDEPENDENT_AMBULATORY_CARE_PROVIDER_SITE_OTHER): Payer: Self-pay | Admitting: Surgery

## 2010-11-08 ENCOUNTER — Ambulatory Visit (INDEPENDENT_AMBULATORY_CARE_PROVIDER_SITE_OTHER): Payer: 59 | Admitting: Surgery

## 2010-11-08 DIAGNOSIS — R1031 Right lower quadrant pain: Secondary | ICD-10-CM

## 2010-11-08 NOTE — Progress Notes (Signed)
HISTORY: Patient is a 40 year old white female. She has chronic right groin pain since cesarean section and hernia repair. She has failed treatment with nonsteroidal anti-inflammatory drugs. She desires an attempt at ilioinguinal nerve block. We have discussed this during previous visits. If the block with Marcaine and steroid is successful, we will proceed with 2 additional blocks in hopes of controlling her pain.   PERTINENT REVIEW OF SYSTEMS: Persistent pain right groin. Aggravating with physical activity. Painful to touch. No evidence of recurrent hernia.   EXAM: Well-healed Pfannenstiel type incision. No sign of hernia. No palpable masses. Mild to moderate tenderness extending from anterior superior iliac spine to pubis. No cutaneous changes.   Procedure: Under aseptic conditions, a solution of Marcaine and Kenalog is injected into the subcutaneous tissues and inter-muscular layers of the right groin just medial to the anterior superior iliac spine. This is well tolerated. A Band-Aid is placed as dressing   IMPRESSION: Chronic pain right groin following hernia repair and cesarean section, suspect chronic pain in distribution of the ilioinguinal nerve.   PLAN: Ilioinguinal nerve block is placed today. Patient will contact us in 48 hours to report on her level of symptomatic relief. If this is successful we will perform another nerve block in 3 weeks.

## 2010-11-10 ENCOUNTER — Telehealth (INDEPENDENT_AMBULATORY_CARE_PROVIDER_SITE_OTHER): Payer: Self-pay | Admitting: Surgery

## 2010-11-11 ENCOUNTER — Telehealth (INDEPENDENT_AMBULATORY_CARE_PROVIDER_SITE_OTHER): Payer: Self-pay

## 2010-11-11 NOTE — Telephone Encounter (Deleted)
Michelle Green groin pain is back.  The nerve block lasted until Tuesday night.  She was up and down step yesterday.  Pain when coughing and sneezing.  What will be the next step?

## 2010-11-11 NOTE — Telephone Encounter (Signed)
Michelle Green stated the block worked until Tuesday night. She was was up and down steps during the day. Same amount of pain at the end of the day. Also pain when sneezing and coughing reported.  What will be the next step for to reduce the pain?     Message from Rise Paganini sent at 11/10/2010 11:41 AM -----  Patient stated that the nerve block performed on Monday didn't work. Please call patient. Thank you.

## 2010-11-11 NOTE — Telephone Encounter (Signed)
Error

## 2010-11-12 NOTE — Telephone Encounter (Signed)
Offer her a repeat block in 3 weeks.  Otherwise, would suggest pain clinic referral. TMG

## 2010-11-17 ENCOUNTER — Ambulatory Visit
Admission: RE | Admit: 2010-11-17 | Discharge: 2010-11-17 | Disposition: A | Discharge status: Home / Self Care | Payer: 59 | Source: Ambulatory Visit | Attending: Internal Medicine | Admitting: Internal Medicine

## 2010-11-17 ENCOUNTER — Other Ambulatory Visit: Payer: Self-pay | Admitting: Internal Medicine

## 2010-11-17 DIAGNOSIS — R52 Pain, unspecified: Secondary | ICD-10-CM

## 2010-12-06 ENCOUNTER — Encounter (INDEPENDENT_AMBULATORY_CARE_PROVIDER_SITE_OTHER): Payer: Self-pay | Admitting: Surgery

## 2010-12-08 ENCOUNTER — Ambulatory Visit (INDEPENDENT_AMBULATORY_CARE_PROVIDER_SITE_OTHER): Payer: 59 | Admitting: Surgery

## 2010-12-08 ENCOUNTER — Encounter (INDEPENDENT_AMBULATORY_CARE_PROVIDER_SITE_OTHER): Payer: Self-pay | Admitting: Surgery

## 2010-12-08 DIAGNOSIS — R1031 Right lower quadrant pain: Secondary | ICD-10-CM

## 2010-12-08 NOTE — Progress Notes (Signed)
Visit Diagnoses: 1. Abdominal pain, chronic, 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     HISTORY: Patient underwent ilioinguinal nerve block. She experienced no relief from symptoms in the right groin region.   EXAM: Not performed.   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   PLAN: I will make a referral to Dr. Sheran Luz at [facility] for evaluation.    Velora Heckler, MD, FACS General & Endocrine Surgery [facility]

## 2010-12-08 NOTE — Patient Instructions (Signed)
Will arrange consultation with Dr. Sheran Luz at [facility], phone [phone].

## 2010-12-09 ENCOUNTER — Other Ambulatory Visit (INDEPENDENT_AMBULATORY_CARE_PROVIDER_SITE_OTHER): Payer: Self-pay | Admitting: Surgery

## 2010-12-09 ENCOUNTER — Telehealth (INDEPENDENT_AMBULATORY_CARE_PROVIDER_SITE_OTHER): Payer: Self-pay

## 2010-12-09 DIAGNOSIS — R1031 Right lower quadrant pain: Secondary | ICD-10-CM

## 2010-12-09 NOTE — Telephone Encounter (Signed)
DONE

## 2011-05-26 ENCOUNTER — Ambulatory Visit (INDEPENDENT_AMBULATORY_CARE_PROVIDER_SITE_OTHER): Payer: 59 | Admitting: Family Medicine

## 2011-05-26 VITALS — BP 137/89 | HR 78 | Temp 98.6°F | Resp 18 | Ht 64.0 in | Wt 247.6 lb

## 2011-05-26 DIAGNOSIS — R109 Unspecified abdominal pain: Secondary | ICD-10-CM

## 2011-05-26 DIAGNOSIS — N200 Calculus of kidney: Secondary | ICD-10-CM

## 2011-05-26 LAB — POCT CBC
Granulocyte percent: 72.7 %G (ref 37–80)
HCT, POC: 43 % (ref 37.7–47.9)
Hemoglobin: 13.9 g/dL (ref 12.2–16.2)
Lymph, poc: 3.4 (ref 0.6–3.4)
MCHC: 32.3 g/dL (ref 31.8–35.4)
MCV: 91 fL (ref 80–97)
POC Granulocyte: 10.8 — AB (ref 2–6.9)
POC LYMPH PERCENT: 23 %L (ref 10–50)

## 2011-05-26 LAB — POCT URINALYSIS DIPSTICK
Bilirubin, UA: NEGATIVE
Glucose, UA: NEGATIVE
Leukocytes, UA: NEGATIVE
Nitrite, UA: NEGATIVE
Urobilinogen, UA: 0.2

## 2011-05-26 LAB — POCT UA - MICROSCOPIC ONLY
Casts, Ur, LPF, POC: NEGATIVE
Mucus, UA: NEGATIVE
WBC, Ur, HPF, POC: NEGATIVE
Yeast, UA: NEGATIVE

## 2011-05-26 MED ORDER — HYDROCODONE-ACETAMINOPHEN 5-500 MG PO TABS: 1.0000 | ORAL_TABLET | Freq: Four times a day (QID) | ORAL | Status: AC | PRN

## 2011-05-26 NOTE — Patient Instructions (Signed)
Push fluids, especially water.  Take pain medication.  RTC if not improved in 24-48 hours.  Ureteral Colic Ureteral colic is spasm-like pain from the kidney or the ureter. This is often caused by a kidney stone. The pain is caused by the stone trying to get through the tubes that pass your pee. HOME CARE   Drink enough fluids to keep your pee (urine) clear or pale yellow.   Strain all your pee. A strainer will be provided. Keep anything caught in the strainer and bring it to your doctor. The stone causing the pain may be very small.   Only take medicine as told by your doctor.   Follow up with your doctor as told.  GET HELP RIGHT AWAY IF:   Pain is not controlled with medicine.   Pain continues or gets worse.   The pain changes and there is chest or belly (abdominal) pain.   You pass out (faint).   You cannot pee.   You keep throwing up (vomiting).   You have a temperature by mouth above 102 F (38.9 C), not controlled by medicine.  MAKE SURE YOU:   Understand these instructions.   Will watch this condition.   Will get help right away if you are not doing well or get worse.  Document Released: 09/14/2007 Document Revised: 12/08/2010 Document Reviewed: 09/14/2007 [facility] Patient Information 2012 [place], [place].

## 2011-05-26 NOTE — Progress Notes (Signed)
  Subjective:    Patient ID: Michelle Green, female    DOB: Sep 18, 1970, 41 y.o.   MRN: [id number]  Flank Pain This is a new problem. The current episode started today. The problem occurs intermittently. The problem has been gradually worsening since onset. The quality of the pain is described as stabbing. The pain does not radiate. The pain is at a severity of 7/10. The pain is moderate. The symptoms are aggravated by sitting. Pertinent negatives include no abdominal pain, chest pain, dysuria, fever or pelvic pain. She has tried nothing for the symptoms.  Pt has had kidney stones twice before.  Has had work-up with urology after second episode last August 2012.  Last episode past in 2 days, pt did not strain urine.    Review of Systems  Constitutional: Negative for fever.  Cardiovascular: Negative for chest pain.  Gastrointestinal: Negative for abdominal pain.  Genitourinary: Positive for flank pain. Negative for dysuria and pelvic pain.  All other systems reviewed and are negative.       Objective:   Physical Exam  Constitutional: She is oriented to person, place, and time. She appears well-developed and well-nourished. She appears distressed.       Morbidly obese.  Pt is obvious discomfort, difficulty sitting.  HENT:  Head: Normocephalic.  Cardiovascular: Normal rate, regular rhythm and normal heart sounds.   Abdominal: Soft.  Musculoskeletal: She exhibits tenderness.       Left flank tenderness.  Neurological: She is alert and oriented to person, place, and time.  Skin: Skin is warm and dry.  Psychiatric: She has a normal mood and affect.          Assessment & Plan:  U/A shows only a trace of blood.  CBC shows slightly elevated WBC's with left shift (noted that pt runs slightly elevated WBC's historically).  Reviewed with Dr. Alwyn Ren.  [facility] records checked and no recent narcotics prescribed to patient.  Advised to push fluids and RTC tomorrow if not improved.  Pt agrees.

## 2011-05-31 ENCOUNTER — Ambulatory Visit
Admission: RE | Admit: 2011-05-31 | Discharge: 2011-05-31 | Disposition: A | Discharge status: Home / Self Care | Payer: 59 | Source: Ambulatory Visit | Attending: Physician Assistant | Admitting: Physician Assistant

## 2011-05-31 ENCOUNTER — Ambulatory Visit (INDEPENDENT_AMBULATORY_CARE_PROVIDER_SITE_OTHER): Payer: 59 | Admitting: Internal Medicine

## 2011-05-31 VITALS — BP 138/88 | HR 86 | Temp 98.2°F | Resp 16 | Ht 64.25 in | Wt 248.8 lb

## 2011-05-31 DIAGNOSIS — N200 Calculus of kidney: Secondary | ICD-10-CM

## 2011-05-31 DIAGNOSIS — M549 Dorsalgia, unspecified: Secondary | ICD-10-CM

## 2011-05-31 DIAGNOSIS — M79609 Pain in unspecified limb: Secondary | ICD-10-CM

## 2011-05-31 DIAGNOSIS — R319 Hematuria, unspecified: Secondary | ICD-10-CM

## 2011-05-31 DIAGNOSIS — R6883 Chills (without fever): Secondary | ICD-10-CM

## 2011-05-31 LAB — POCT UA - MICROSCOPIC ONLY: Mucus, UA: POSITIVE

## 2011-05-31 LAB — POCT CBC
Granulocyte percent: 72.6 %G (ref 37–80)
MCV: 90.3 fL (ref 80–97)
MID (cbc): 0.5 (ref 0–0.9)
MPV: 8.8 fL (ref 0–99.8)
POC Granulocyte: 7.6 — AB (ref 2–6.9)
POC LYMPH PERCENT: 22.5 %L (ref 10–50)
POC MID %: 4.9 %M (ref 0–12)
Platelet Count, POC: 314 10*3/uL (ref 142–424)
RDW, POC: 13.8 %

## 2011-05-31 LAB — POCT URINALYSIS DIPSTICK
Bilirubin, UA: NEGATIVE
Ketones, UA: NEGATIVE
Leukocytes, UA: NEGATIVE
Spec Grav, UA: 1.015

## 2011-05-31 MED ORDER — OXYCODONE-ACETAMINOPHEN 5-325 MG PO TABS: 1.0000 | ORAL_TABLET | Freq: Three times a day (TID) | ORAL | Status: DC | PRN

## 2011-05-31 MED ORDER — KETOROLAC TROMETHAMINE 60 MG/2ML IM SOLN
60.0000 mg | Freq: Once | INTRAMUSCULAR | Status: AC
  Administered 2011-05-31: 60 mg via INTRAMUSCULAR

## 2011-05-31 MED ORDER — TAMSULOSIN HCL 0.4 MG PO CAPS: 0.4000 mg | ORAL_CAPSULE | Freq: Every day | ORAL | Status: DC

## 2011-05-31 NOTE — Progress Notes (Signed)
Subjective:    Patient ID: Michelle Green, female    DOB: October 29, 1970, 41 y.o.   MRN: [id number]  HPI Pt presents with continued pain and now with chills and nausea.  She is concerned because she is leaving for [place].  Summer of 2012 had kidney stone with spontaneous pain improvement, she did have to have CT scan for diagnosis.  Pt is not good at straining urine.  Her pain currently waxes and wanes more on the L but some pain on the R now.  The vicodin only helps when she doubles up but not really giving her pain relief.  She has nausea with the intense pain.  Some increase in pain after urination.  Some chills today but no fever.  This pain does not feel like musculoskeletal in origin, it does not change with movement.  She has no abd pain.   Review of Systems  Constitutional: Positive for chills. Negative for fever.  Genitourinary: Positive for flank pain (L>R). Negative for dysuria, frequency and difficulty urinating.  Musculoskeletal: Positive for back pain. Negative for myalgias and gait problem.       Objective:   Physical Exam  Constitutional: She is oriented to person, place, and time. She appears well-developed and well-nourished.  HENT:  Head: Normocephalic and atraumatic.  Cardiovascular: Normal rate, regular rhythm and normal heart sounds.   No murmur heard. Pulmonary/Chest: Effort normal and breath sounds normal.  Abdominal: Soft. There is no tenderness. There is CVA tenderness (L>R).  Neurological: She is alert and oriented to person, place, and time.  Skin: Skin is warm and dry.  Psychiatric: She has a normal mood and affect. Her behavior is normal. Judgment and thought content normal.    Results for orders placed in visit on 05/31/11  POCT URINALYSIS DIPSTICK      Component Value Range   Color, UA yellow     Clarity, UA clear     Glucose, UA neg     Bilirubin, UA neg     Ketones, UA neg     Spec Grav, UA 1.015     Blood, UA neg     pH, UA 8.0     Protein,  UA trace     Urobilinogen, UA 0.2     Nitrite, UA neg     Leukocytes, UA Negative    POCT UA - MICROSCOPIC ONLY      Component Value Range   WBC, Ur, HPF, POC 0-1     RBC, urine, microscopic 1-3     Bacteria, U Microscopic trace     Mucus, UA positive     Epithelial cells, urine per micros 3-6     Crystals, Ur, HPF, POC neg     Casts, Ur, LPF, POC neg     Yeast, UA neg    POCT CBC      Component Value Range   WBC 10.5 (*) 4.6 - 10.2 (K/uL)   Lymph, poc 2.4  0.6 - 3.4    POC LYMPH PERCENT 22.5  10 - 50 (%L)   MID (cbc) 0.5  0 - 0.9    POC MID % 4.9  0 - 12 (%M)   POC Granulocyte 7.6 (*) 2 - 6.9    Granulocyte percent 72.6  37 - 80 (%G)   RBC 4.87  4.04 - 5.48 (M/uL)   Hemoglobin 14.3  12.2 - 16.2 (g/dL)   HCT, POC 95.6  21.3 - 47.9 (%)   MCV 90.3  80 -  97 (fL)   MCH, POC 29.4  27 - 31.2 (pg)   MCHC 32.5  31.8 - 35.4 (g/dL)   RDW, POC 40.9     Platelet Count, POC 314  142 - 424 (K/uL)   MPV 8.8  0 - 99.8 (fL)   CBC improved from last visit. Urine about the same.      Assessment & Plan:   1. Kidney stones  POCT Urinalysis Dipstick, POCT UA - Microscopic Only, CT Abdomen Pelvis Wo Contrast  2. Acute back pain  POCT CBC, oxyCODONE-acetaminophen (ROXICET) 5-325 MG per tablet, Tamsulosin HCl (FLOMAX) 0.4 MG CAPS, ketorolac (TORADOL) injection 60 mg  3. Chills (without fever)  POCT CBC  4. Hematuria  CT Abdomen Pelvis Wo Contrast, Tamsulosin HCl (FLOMAX) 0.4 MG CAPS, ketorolac (TORADOL) injection 60 mg   Pt for CT urogram because continued pain to r/o obstruction.  Will refer if needed.

## 2011-05-31 NOTE — Patient Instructions (Signed)

## 2011-06-04 ENCOUNTER — Telehealth: Payer: Self-pay

## 2011-06-04 DIAGNOSIS — N200 Calculus of kidney: Secondary | ICD-10-CM

## 2011-06-04 NOTE — Telephone Encounter (Signed)
Pt states that she was in to see Benny Lennert for a kidney stone pt states it is not any better and would like for Benny Lennert to give her call back

## 2011-06-05 ENCOUNTER — Other Ambulatory Visit: Payer: Self-pay | Admitting: Internal Medicine

## 2011-06-05 DIAGNOSIS — M549 Dorsalgia, unspecified: Secondary | ICD-10-CM

## 2011-06-05 MED ORDER — OXYCODONE-ACETAMINOPHEN 5-325 MG PO TABS: 1.0000 | ORAL_TABLET | Freq: Three times a day (TID) | ORAL | Status: AC | PRN

## 2011-06-05 NOTE — Telephone Encounter (Signed)
Patient notified and rx in pickup drawer. 

## 2011-06-05 NOTE — Telephone Encounter (Signed)
Is referral to urology next step?  Pls adivise

## 2011-06-05 NOTE — Telephone Encounter (Signed)
Please tell pt we will refer her to urology to have kidney stone addressed and to come to clinic to pick up refill on her Percocet.

## 2011-06-05 NOTE — Telephone Encounter (Signed)
Pt states that she is severe pain and would like to know if we can call in a refill for percocet as soon as possible to the [facility] on [address].

## 2011-07-06 ENCOUNTER — Other Ambulatory Visit: Payer: Self-pay | Admitting: Obstetrics and Gynecology

## 2011-07-06 DIAGNOSIS — Z1231 Encounter for screening mammogram for malignant neoplasm of breast: Secondary | ICD-10-CM

## 2011-07-28 ENCOUNTER — Ambulatory Visit
Admission: RE | Admit: 2011-07-28 | Discharge: 2011-07-28 | Disposition: A | Discharge status: Home / Self Care | Payer: 59 | Source: Ambulatory Visit | Attending: Obstetrics and Gynecology | Admitting: Obstetrics and Gynecology

## 2011-07-28 DIAGNOSIS — Z1231 Encounter for screening mammogram for malignant neoplasm of breast: Secondary | ICD-10-CM

## 2011-08-25 ENCOUNTER — Other Ambulatory Visit: Payer: Self-pay | Admitting: Orthopedic Surgery

## 2011-08-25 DIAGNOSIS — M25561 Pain in right knee: Secondary | ICD-10-CM

## 2011-08-26 ENCOUNTER — Ambulatory Visit
Admission: RE | Admit: 2011-08-26 | Discharge: 2011-08-26 | Disposition: A | Discharge status: Home / Self Care | Payer: 59 | Source: Ambulatory Visit | Attending: Orthopedic Surgery | Admitting: Orthopedic Surgery

## 2011-08-26 DIAGNOSIS — M25561 Pain in right knee: Secondary | ICD-10-CM

## 2011-12-10 ENCOUNTER — Emergency Department (HOSPITAL_BASED_OUTPATIENT_CLINIC_OR_DEPARTMENT_OTHER)
Admission: EM | Admit: 2011-12-10 | Discharge: 2011-12-10 | Disposition: A | Discharge status: Home / Self Care | Payer: 59 | Attending: Emergency Medicine | Admitting: Emergency Medicine

## 2011-12-10 ENCOUNTER — Emergency Department (HOSPITAL_BASED_OUTPATIENT_CLINIC_OR_DEPARTMENT_OTHER): Payer: 59

## 2011-12-10 ENCOUNTER — Encounter (HOSPITAL_BASED_OUTPATIENT_CLINIC_OR_DEPARTMENT_OTHER): Payer: Self-pay | Admitting: *Deleted

## 2011-12-10 DIAGNOSIS — Z87891 Personal history of nicotine dependence: Secondary | ICD-10-CM | POA: Insufficient documentation

## 2011-12-10 DIAGNOSIS — Z79899 Other long term (current) drug therapy: Secondary | ICD-10-CM | POA: Insufficient documentation

## 2011-12-10 DIAGNOSIS — N200 Calculus of kidney: Secondary | ICD-10-CM | POA: Insufficient documentation

## 2011-12-10 DIAGNOSIS — E079 Disorder of thyroid, unspecified: Secondary | ICD-10-CM | POA: Insufficient documentation

## 2011-12-10 DIAGNOSIS — F329 Major depressive disorder, single episode, unspecified: Secondary | ICD-10-CM | POA: Insufficient documentation

## 2011-12-10 DIAGNOSIS — J45909 Unspecified asthma, uncomplicated: Secondary | ICD-10-CM | POA: Insufficient documentation

## 2011-12-10 DIAGNOSIS — F3289 Other specified depressive episodes: Secondary | ICD-10-CM | POA: Insufficient documentation

## 2011-12-10 DIAGNOSIS — Z85850 Personal history of malignant neoplasm of thyroid: Secondary | ICD-10-CM | POA: Insufficient documentation

## 2011-12-10 DIAGNOSIS — Z9089 Acquired absence of other organs: Secondary | ICD-10-CM | POA: Insufficient documentation

## 2011-12-10 DIAGNOSIS — E785 Hyperlipidemia, unspecified: Secondary | ICD-10-CM | POA: Insufficient documentation

## 2011-12-10 LAB — URINALYSIS, ROUTINE W REFLEX MICROSCOPIC
Bilirubin Urine: NEGATIVE
Nitrite: NEGATIVE
Protein, ur: NEGATIVE mg/dL
Specific Gravity, Urine: 1.029 (ref 1.005–1.030)
Urobilinogen, UA: 0.2 mg/dL (ref 0.0–1.0)

## 2011-12-10 LAB — BASIC METABOLIC PANEL
CO2: 25 mEq/L (ref 19–32)
Chloride: 100 mEq/L (ref 96–112)
GFR calc Af Amer: >90 mL/min (ref >90)
Potassium: 3.6 mEq/L (ref 3.5–5.1)
Sodium: 137 mEq/L (ref 135–145)

## 2011-12-10 LAB — PREGNANCY, URINE: Preg Test, Ur: NEGATIVE

## 2011-12-10 MED ORDER — OXYCODONE-ACETAMINOPHEN 5-325 MG PO TABS: 2.0000 | ORAL_TABLET | Freq: Four times a day (QID) | ORAL | Status: AC | PRN

## 2011-12-10 MED ORDER — TAMSULOSIN HCL 0.4 MG PO CAPS: 0.4000 mg | ORAL_CAPSULE | Freq: Every day | ORAL | Status: DC

## 2011-12-10 MED ORDER — OXYCODONE-ACETAMINOPHEN 5-325 MG PO TABS
2.0000 | ORAL_TABLET | Freq: Once | ORAL | Status: AC
  Administered 2011-12-10: 2 via ORAL
  Filled 2011-12-10 (×2): qty 2

## 2011-12-10 MED ORDER — ONDANSETRON HCL 4 MG/2ML IJ SOLN
4.0000 mg | Freq: Once | INTRAMUSCULAR | Status: AC
  Administered 2011-12-10: 4 mg via INTRAVENOUS
  Filled 2011-12-10: qty 2

## 2011-12-10 MED ORDER — SODIUM CHLORIDE 0.9 % IV BOLUS (SEPSIS)
1000.0000 mL | Freq: Once | INTRAVENOUS | Status: AC
  Administered 2011-12-10: 1000 mL via INTRAVENOUS

## 2011-12-10 MED ORDER — ONDANSETRON 8 MG PO TBDP: 8.0000 mg | ORAL_TABLET | Freq: Three times a day (TID) | ORAL | Status: AC | PRN

## 2011-12-10 MED ORDER — MORPHINE SULFATE 4 MG/ML IJ SOLN
4.0000 mg | Freq: Once | INTRAMUSCULAR | Status: AC
  Administered 2011-12-10: 4 mg via INTRAVENOUS
  Filled 2011-12-10: qty 1

## 2011-12-10 NOTE — ED Notes (Signed)
Pt reports left flank pain since 1400- hx of kidney stones- also c/o pain in urethra with flank pain

## 2011-12-11 NOTE — ED Provider Notes (Signed)
History     CSN: [id number]  Arrival date & time 12/10/11  1714   First MD Initiated Contact with Patient 12/10/11 1729      Chief Complaint  Patient presents with  . Flank Pain    (Consider location/radiation/quality/duration/timing/severity/associated sxs/prior treatment) HPI Patient is a 41 yo female with history of nephrolithiasis who presents after severe left flank pain with associated nausea that began several hours prior to presentation that is consistent with prior kidney stones.  Patient was at a child's athletic event sitting still when this began.  She endorsed some mild dysuria.  Patient has had stuttering symptoms over the last 2 weeks with some dysuria and left sided abdominal pain but this had resolved.  Patient reports pain is a sharp ache and 8/10.  Nothing has made it better or worse and she did not have any meds PTA. There are no other associated or modifying factors.  Past Medical History  Diagnosis Date  . Thyroid disease     Cancer  . Asthma   . Cancer     thyroid  . Depression     mild  . Heart murmur     previoulsy until nov 1996  . Hyperlipidemia   . Kidney stone 10/2010  . SOB (shortness of breath)   . Bronchitis   . Pneumonia   . Generalized headaches   . Sinus problem   . Swollen lymph nodes     Past Surgical History  Procedure Date  . Cholecystectomy   . Hernia repair 1998  . Cesarean section 1997, 2001  . Thyroidectomy 2003  . Tubal ligation     Confirm date with patient  . Lymphnode biposy     left posterior cervical 08/27/2010  . Uterine ablation     Family History  Problem Relation Age of Onset  . Adopted: Yes    History  Substance Use Topics  . Smoking status: Former Smoker    Types: Cigarettes    Quit date: 12/27/2010  . Smokeless tobacco: Never Used   Comment: 10 cig a day  . Alcohol Use: Yes     rare    OB History    Grav Para Term Preterm Abortions TAB SAB Ect Mult Living                  Review of Systems    Constitutional: Negative.   HENT: Negative.   Eyes: Negative.   Respiratory: Negative.   Cardiovascular: Negative.   Gastrointestinal: Positive for nausea.  Genitourinary: Positive for dysuria and flank pain.  Musculoskeletal: Negative.   Skin: Negative.   Neurological: Negative.   Hematological: Negative.   Psychiatric/Behavioral: Negative.   All other systems reviewed and are negative.    Allergies  Penicillins  Home Medications   Current Outpatient Rx  Name Route Sig Dispense Refill  . ALBUTEROL SULFATE HFA 108 (90 BASE) MCG/ACT IN AERS Inhalation Inhale 2 puffs into the lungs every 4 (four) hours as needed. For wheezing and shortness of breath.    . ARIPIPRAZOLE 5 MG PO TABS Oral Take 5 mg by mouth daily.    . DESVENLAFAXINE SUCCINATE ER 50 MG PO TB24 Oral Take 50 mg by mouth daily.    Marland Kitchen LEVOTHYROXINE SODIUM 150 MCG PO TABS Oral Take 150 mcg by mouth 2 (two) times daily.    . TOPIRAMATE 25 MG PO CPSP Oral Take 25 mg by mouth 2 (two) times daily.     Marland Kitchen ZOLPIDEM TARTRATE 10 MG PO  TABS Oral Take 10 mg by mouth at bedtime as needed. For sleep.    Marland Kitchen ONDANSETRON 8 MG PO TBDP Oral Take 1 tablet (8 mg total) by mouth every 8 (eight) hours as needed for nausea. 20 tablet 0  . OXYCODONE-ACETAMINOPHEN 5-325 MG PO TABS Oral Take 2 tablets by mouth every 6 (six) hours as needed for pain. 30 tablet 0  . TAMSULOSIN HCL 0.4 MG PO CAPS Oral Take 1 capsule (0.4 mg total) by mouth daily. 30 capsule 0    BP 151/94  Pulse 105  Temp 99.1 F (37.3 C) (Oral)  Resp 20  Ht 5\' 4"  (1.626 m)  Wt 250 lb (113.399 kg)  BMI 42.91 kg/m2  SpO2 100%  Physical Exam  Nursing note and vitals reviewed. GEN: Well-developed, well-nourished female in no distress HEENT: Atraumatic, normocephalic. Oropharynx clear without erythema EYES: PERRLA BL, no scleral icterus. NECK: Trachea midline, no meningismus CV: regular rate and rhythm. No murmurs, rubs, or gallops PULM: No respiratory distress.  No  crackles, wheezes, or rales. GI: soft, non-tender. No guarding, rebound, or tenderness. + bowel sounds  GU: left CVAT Neuro: cranial nerves grossly 2-12 intact, no abnormalities of strength or sensation, A and O x 3 MSK: Patient moves all 4 extremities symmetrically, no deformity, edema, or injury noted Skin: No rashes petechiae, purpura, or jaundice Psych: no abnormality of mood   ED Course  Procedures (including critical care time)  Labs Reviewed  URINALYSIS, ROUTINE W REFLEX MICROSCOPIC - Abnormal; Notable for the following:    Hgb urine dipstick TRACE (*)     All other components within normal limits  BASIC METABOLIC PANEL - Abnormal; Notable for the following:    Glucose, Bld 103 (*)     GFR calc non Af Amer 78 (*)     All other components within normal limits  PREGNANCY, URINE  URINE MICROSCOPIC-ADD ON   US Renal  12/10/2011  *RADIOLOGY REPORT*  Clinical Data: Left flank pain, history kidney stones  RENAL/URINARY TRACT ULTRASOUND COMPLETE  Comparison:  None Correlation:  CT abdomen and pelvis 05/31/2011  Findings:  Right Kidney:  12.1 cm length.  Question mild cortical thinning for age.  Normal cortical echogenicity.  No mass, hydronephrosis shadowing calcification.  No perinephric fluid.  Left Kidney:  12.3 cm length.  Question mild cortical thinning for age.  Normal cortical echogenicity.  No mass or hydronephrosis. Questionable 7 mm shadowing calculus at mid left kidney versus artifact. No perinephric fluid.  Bladder:  Normal appearance.  Bilateral ureteral jets identified.  IMPRESSION: Potential 7 mm nonobstructing left renal calculus. Question mild renal cortical thinning for age. No evidence of mass or hydronephrosis.   Original Report Authenticated By: Lollie Marrow, M.D.      1. Nephrolithiasis       MDM  Patient evaluated and found to have left flank pain consistent with prior kidney stones.  Patient had no signs of infection on UA and renal function was intact.   Renal US showed non-obstructing 7 mm left sided stone.  Patient felt better after single round of pain meds.  She was comfortable following up with her urologist on Tuesday and was discharged with po pain and nausea meds as well as flomax.  She was able to tolerate po prior to discharge.  Patient told to return if she is unable to tolerate po or has other concerning symptoms.         Cyndra Numbers, MD 12/11/11 1057

## 2012-04-12 ENCOUNTER — Telehealth: Payer: Self-pay | Admitting: *Deleted

## 2012-04-12 NOTE — Telephone Encounter (Signed)
Pharmacy requesting refill on Ventolin inhaler.  Last refill on 04/07/11

## 2012-04-13 MED ORDER — ALBUTEROL SULFATE HFA 108 (90 BASE) MCG/ACT IN AERS: 2.0000 | INHALATION_SPRAY | RESPIRATORY_TRACT | Status: AC | PRN

## 2012-04-13 NOTE — Telephone Encounter (Signed)
Thanks, I have called patient to advise.  

## 2012-04-13 NOTE — Telephone Encounter (Signed)
Reordered albuterol.  Pt needs an office visit to discuss for more.  Or her PCP needs to prescribe this

## 2012-07-13 ENCOUNTER — Other Ambulatory Visit: Payer: Self-pay | Admitting: Obstetrics and Gynecology

## 2012-07-13 DIAGNOSIS — Z1231 Encounter for screening mammogram for malignant neoplasm of breast: Secondary | ICD-10-CM

## 2012-07-30 ENCOUNTER — Ambulatory Visit: Payer: 59

## 2012-08-10 ENCOUNTER — Encounter: Payer: Self-pay | Admitting: Neurology

## 2012-08-10 ENCOUNTER — Ambulatory Visit (INDEPENDENT_AMBULATORY_CARE_PROVIDER_SITE_OTHER): Payer: 59 | Admitting: Neurology

## 2012-08-10 VITALS — BP 131/81 | HR 85 | Ht 64.5 in | Wt 260.0 lb

## 2012-08-10 DIAGNOSIS — G4733 Obstructive sleep apnea (adult) (pediatric): Secondary | ICD-10-CM | POA: Insufficient documentation

## 2012-08-10 NOTE — Progress Notes (Signed)
How likely are you to doze in the following situations: 0 = not likely, 1 = slight chance, 2 = moderate chance, 3 = high chance  Sitting and Reading?  2 Watching Television?  2 Sitting inactive in a public place (theater or meeting)?  1 Lying down in the afternoon when circumstances permit?  *2* Sitting and talking to someone?  3 Sitting quietly after lunch without alcohol?  12 In a car, while stopped for a few minutes in traffic?  2 As a passenger in a car for an hour without a break?  1  Total = 14  [facility]  Provider:  Dr Rodert Hinch Referring Provider: Katy Apo, MD Primary Care Physician:  Katy Apo, MD  Chief Complaint  Patient presents with  . Neurologic Problem    Sleep    HPI:  Michelle Green is a 42 y.o. female here as a referral from Dr. Nehemiah Settle for evaluation of possible sleep apnea. Dr. Adrian Prince , her endocrinologist, had referred this 42 year old Caucasian right-handed female with a past medical history of insomnia, diabetes, papillary adenocarcinoma of the thyroid, cervical lymph adenopathy, morbid obesity, slurred for more surgery, hyperlipidemia.   Sleep complains of excessive daytime sleepiness, witnessed apnea, witnessed snoring, and frequent sleep interruptions leading to nonrestorative sleep were mentioned as well as nocturia. The patient endorsed the sleepiness scale at 7 point at the time of her study on 05/29/2012 the patient had a BMI of 43.4. Her a age by of sonorous apnea hypoxia index was 28.8 ROD eye was G. and supine sleep was 100% present . Witnessed snoring was severe continued, oxygen nadir was 75%.  The patient was therefore placed on a CPAP and titrated and a split night protocol.  She responded well to 7 cm water with an AHI of 3.4 but this was not an optimal pressure CPAP was therefore prescribed at 8 cm water.. Her primary care physician and her endocrinologist to work with her on a weight loss program and she  has lost so far 10 pounds. She is using AP lateral mouth at home. Patient quit smoking September 2012. Review of Systems: Out of a complete 14 system review, the patient complains of only the following symptoms, and all other reviewed systems are negative.  The patient reports a residual sleepiness score of 14 points. So after she received her CPAP for home use, she became ill with flu and had 3 or 4 in the first month and lower compliance. She is now back on board. I explained the need for 4 hours or more of nightly use  of the CPAP machine.   History   Social History  . Marital Status: Married    Spouse Name: N/A    Number of Children: 2  . Years of Education: N/A   Occupational History  .     Social History Main Topics  . Smoking status: Former Smoker    Types: Cigarettes    Quit date: 12/27/2010  . Smokeless tobacco: Never Used     Comment: Reached 10- 20 cigarttes daily at quitting    . Alcohol Use: Yes     Comment: rare  . Drug Use: No  . Sexually Active: Not on file   Other Topics Concern  . Not on file   Social History Narrative   Caffeine Use- 3 drinks per daily    Family History  Problem Relation Age of Onset  . Adopted: Yes  . Bladder Cancer Mother  Past Medical History  Diagnosis Date  . Thyroid disease     Cancer  . Asthma   . Cancer     thyroid  . Depression     mild  . Heart murmur     previoulsy until nov 1996  . Hyperlipidemia   . Kidney stone 10/2010  . SOB (shortness of breath)   . Bronchitis   . Pneumonia   . Generalized headaches   . Sinus problem   . Swollen lymph nodes   . GERD (gastroesophageal reflux disease) 2001  . Pseudotumor cerebri   . Apnea   . Nocturia   . Insomnia   . Non-restorative sleep   . Morbid obesity   . Snoring   . Cervical lymphadenopathy     Past Surgical History  Procedure Laterality Date  . Cholecystectomy    . Hernia repair  1998  . Cesarean section  1997, 2001  . Thyroidectomy  2003     Papillary Thyroid CA  . Tubal ligation      Confirm date with patient  . Lymphnode biposy      left posterior cervical 08/27/2010  . Uterine ablation    . Gastric bypass      Current Outpatient Prescriptions  Medication Sig Dispense Refill  . albuterol (VENTOLIN HFA) 108 (90 BASE) MCG/ACT inhaler Inhale 2 puffs into the lungs every 4 (four) hours as needed. For wheezing and shortness of breath.  6.7 g  0  . ARIPiprazole (ABILIFY) 5 MG tablet Take 5 mg by mouth daily.      Marland Kitchen desvenlafaxine (PRISTIQ) 50 MG 24 hr tablet Take 50 mg by mouth daily.      Marland Kitchen levothyroxine (SYNTHROID, LEVOTHROID) 150 MCG tablet Take 150 mcg by mouth 2 (two) times daily.      . Tamsulosin HCl (FLOMAX) 0.4 MG CAPS Take 1 capsule (0.4 mg total) by mouth daily.  30 capsule  0  . topiramate (TOPAMAX) 25 MG capsule Take 25 mg by mouth 2 (two) times daily.       Marland Kitchen zolpidem (AMBIEN) 10 MG tablet Take 10 mg by mouth at bedtime as needed. For sleep.       No current facility-administered medications for this visit.    Allergies as of 08/10/2012 - Review Complete 08/10/2012  Allergen Reaction Noted  . Penicillins Hives 09/29/2010    Vitals: BP 131/81  Pulse 85  Ht 5' 4.5" (1.638 m)  Wt 260 lb (117.935 kg)  BMI 43.96 kg/m2 Last Weight:  Wt Readings from Last 1 Encounters:  08/10/12 260 lb (117.935 kg)   Last Height:   Ht Readings from Last 1 Encounters:  08/10/12 5' 4.5" (1.638 m)   Vision Screening:  Vitals. Physical exam:  General: The patient is awake, alert and appears not in acute distress. The patient is well groomed. Head: Normocephalic, atraumatic. Neck is supple. Mallampati3, neck circumference:no retrognathia.. no nasal constriction, and no presence of T.M. J.  Cardiovascular:  Regular rate and rhythm,62 , without  murmurs or carotid bruit, and without distended neck veins. Respiratory: Lungs are clear to auscultation. Skin:  Without evidence of edema, or rash Trunk: BMI is elevated ,the  patient   has normal posture.  Neurologic exam : The patient is awake and alert, oriented to place and time.  Memory subjective  described as intact. There is a normal attention span & concentration ability. Speech is fluent without  dysarthria, dysphonia or aphasia. Mood and affect are appropriate.  Cranial nerves: Pupils  are equal and briskly reactive to light. Funduscopic exam without   evidence of pallor or edema. Extraocular movements  in vertical and horizontal planes intact and without nystagmus. Visual fields by finger perimetry are intact. Hearing to finger rub intact.  Facial sensation intact to fine touch. Facial motor strength is symmetric and tongue and uvula move midline.  Motor exam:   Normal tone and normal muscle bulk and symmetric normal strength in all extremities.  Sensory:  Fine touch, pinprick and vibration were tested in all extremities. Proprioception is tested in the upper extremities only. This was  normal.  Coordination: Rapid alternating movements in the fingers/hands is tested and normal. Finger-to-nose maneuver tested and normal without evidence of ataxia, dysmetria or tremor.  Gait and station: Patient walks without assistive device and is able and assisted stool climb up to the exam table. Strength within normal limits. Stance is stable and normal.  Deep tendon reflexes: in the upper and lower extremities are symmetric and intact.  Babinski maneuver response is  downgoing.   Assessment:  After physical and neurologic examination, review of laboratory studies, imaging, neurophysiology testing and pre-existing records, assessment will be reviewed on the problem list.  Plan:  Treatment plan and additional workup will be reviewed under Problem List.  This patient's daytime sleepiness score of 14 point remains elevated. However her detail of CPAP data was between March -May 1st  2014, and  show a residual AHI of only 0.7,  no air leaks and a set pressure of 8 cm water with  3 cm EPR. CPAP data  all obtained in the office mask and machine reviewed. Sleep study reviewed and discussed.  I hope that for the next 30 days with increased nightly use of the CPAP the patient sleepiness degree will decline. I am also happy that she has undertaken as serious effort for weight loss, as her high BMI is her main risk factor for obstructive sleep apnea. Nocturia has gotten better.

## 2012-08-10 NOTE — Patient Instructions (Signed)
Sleep Apnea  Sleep apnea is a sleep disorder characterized by abnormal pauses in breathing while you sleep. When your breathing pauses, the level of oxygen in your blood decreases. This causes you to move out of deep sleep and into light sleep. As a result, your quality of sleep is poor, and the system that carries your blood throughout your body (cardiovascular system) experiences stress. If sleep apnea remains untreated, the following conditions can develop:  High blood pressure (hypertension).  Coronary artery disease.  Inability to achieve or maintain an erection (impotence).  Impairment of your thought process (cognitive dysfunction). There are three types of sleep apnea: 1. Obstructive sleep apnea Pauses in breathing during sleep because of a blocked airway. 2. Central sleep apnea Pauses in breathing during sleep because the area of the brain that controls your breathing does not send the correct signals to the muscles that control breathing. 3. Mixed sleep apnea A combination of both obstructive and central sleep apnea. RISK FACTORS The following risk factors can increase your risk of developing sleep apnea:  Being overweight.  Smoking.  Having narrow passages in your nose and throat.  Being of older age.  Being female.  Alcohol use.  Sedative and tranquilizer use.  Ethnicity. Among individuals younger than 35 years, African Americans are at increased risk of sleep apnea. SYMPTOMS   Difficulty staying asleep.  Daytime sleepiness and fatigue.  Loss of energy.  Irritability.  Loud, heavy snoring.  Morning headaches.  Trouble concentrating.  Forgetfulness.  Decreased interest in sex. DIAGNOSIS  In order to diagnose sleep apnea, your caregiver will perform a physical examination. Your caregiver may suggest that you take a home sleep test. Your caregiver may also recommend that you spend the night in a sleep lab. In the sleep lab, several monitors record  information about your heart, lungs, and brain while you sleep. Your leg and arm movements and blood oxygen level are also recorded. TREATMENT The following actions may help to resolve mild sleep apnea:  Sleeping on your side.   Using a decongestant if you have nasal congestion.   Avoiding the use of depressants, including alcohol, sedatives, and narcotics.   Losing weight and modifying your diet if you are overweight. There also are devices and treatments to help open your airway:  Oral appliances. These are custom-made mouthpieces that shift your lower jaw forward and slightly open your bite. This opens your airway.  Devices that create positive airway pressure. This positive pressure "splints" your airway open to help you breathe better during sleep. The following devices create positive airway pressure:  Continuous positive airway pressure (CPAP) device. The CPAP device creates a continuous level of air pressure with an air pump. The air is delivered to your airway through a mask while you sleep. This continuous pressure keeps your airway open.  Nasal expiratory positive airway pressure (EPAP) device. The EPAP device creates positive air pressure as you exhale. The device consists of single-use valves, which are inserted into each nostril and held in place by adhesive. The valves create very little resistance when you inhale but create much more resistance when you exhale. That increased resistance creates the positive airway pressure. This positive pressure while you exhale keeps your airway open, making it easier to breath when you inhale again.  Bilevel positive airway pressure (BPAP) device. The BPAP device is used mainly in patients with central sleep apnea. This device is similar to the CPAP device because it also uses an air pump to deliver  continuous air pressure through a mask. However, with the BPAP machine, the pressure is set at two different levels. The pressure when you  exhale is lower than the pressure when you inhale.  Surgery. Typically, surgery is only done if you cannot comply with less invasive treatments or if the less invasive treatments do not improve your condition. Surgery involves removing excess tissue in your airway to create a wider passage way. Document Released: 03/18/2002 Document Revised: 09/27/2011 Document Reviewed: 08/04/2011 [facility] Patient Information 2013 [place], [place]. CPAP and BIPAP CPAP and BIPAP are methods of helping you breathe. CPAP stands for "continuous positive airway pressure." BIPAP stands for "bi-level positive airway pressure." Both CPAP and BIPAP are provided by a small machine with a flexible plastic tube that attaches to a plastic mask that goes over your nose or mouth. Air is blown into your air passages through your nose or mouth. This helps to keep your airways open and helps to keep you breathing well. The amount of pressure that is used to blow the air into your air passages can be set on the machine. The pressure setting is based on your needs. With CPAP, the amount of pressure stays the same while you breathe in and out. With BIPAP, the amount of pressure changes when you inhale and exhale. Your caregiver will recommend whether CPAP or BIPAP would be more helpful for you.  CPAP and BIPAP can be helpful for both adults and children with:  Sleep apnea.  Chronic Obstructive Pulmonary Disease (COPD), a condition like emphysema.  Diseases which weaken the muscles of the chest such as muscular dystrophy or neurological diseases.  Other problems that cause breathing to be weak or difficult. USE OF CPAP OR BIPAP The respiratory therapist or technician will help you get used to wearing the mask. Some people feel claustrophobic (a trapped or closed in feeling) at first, because the mask needs to be fairly snug on your face.   It may help you to get used to the mask gradually, by first holding the mask loosely over your  nose or mouth using a low pressure setting on the machine. Gradually the mask can be applied more snugly with increased pressure. You can also gradually increase the amount of time the mask is used.  People with sleep apnea will use the mask and machine at night when they are sleeping. Others, like those with ALS or other breathing difficulties, may need the CPAP or BIPAP all the time.  If the first mask you try does not fit well, or is uncomfortable, there are other types and sizes that can be tried.  If you tend to breathe through your mouth, a chin strap may be applied to help keep your mouth closed (if you are using a nasal mask).  The CPAP and BIPAP machines have alarms that may sound if the mask comes off or develops a leak.  You should not eat or drink while the CPAP or BIPAP is on. Food or fluids could get pushed into your lungs by the pressure of the CPAP or BIPAP. Sometimes CPAP or BIPAP machines are ordered for home use. If you are going to use the CPAP or BIPAP machine at home, follow these instructions  CPAP or BIPAP machines can be rented or purchased through home health care companies. There are many different brands of machines available. If you rent a machine before purchasing you may find which particular machine works well for you.  Ask questions if there is  something you do not understand when picking out your machine.  Place your CPAP or BIPAP machine on a secure table or stand near an electrical outlet.  Know where the On/Off switch is.  Follow your doctor's instructions for how to set the pressure on your machine and when you should use it.  Do not smoke! Tobacco smoke residue can damage the machine. SEEK IMMEDIATE MEDICAL CARE IF:   You have redness or open areas around your nose or mouth.  You have trouble operating the CPAP or BIPAP machine.  You cannot tolerate wearing the CPAP or BIPAP mask.  You have any questions or concerns. Document Released:  12/25/2003 Document Revised: 06/20/2011 Document Reviewed: 03/25/2008 [facility] Patient Information 2013 [place], [place].

## 2012-08-13 ENCOUNTER — Ambulatory Visit
Admission: RE | Admit: 2012-08-13 | Discharge: 2012-08-13 | Disposition: A | Discharge status: Home / Self Care | Payer: 59 | Source: Ambulatory Visit | Attending: Obstetrics and Gynecology | Admitting: Obstetrics and Gynecology

## 2012-08-13 DIAGNOSIS — Z1231 Encounter for screening mammogram for malignant neoplasm of breast: Secondary | ICD-10-CM

## 2012-10-09 ENCOUNTER — Encounter: Payer: Self-pay | Admitting: Neurology

## 2012-11-15 ENCOUNTER — Ambulatory Visit: Payer: 59 | Admitting: Neurology

## 2013-01-02 ENCOUNTER — Ambulatory Visit: Payer: 59 | Admitting: Neurology

## 2013-06-05 ENCOUNTER — Ambulatory Visit: Payer: 59 | Admitting: Neurology

## 2013-08-29 IMAGING — MG MM DIGITAL SCREENING BILAT
5 series · 5 of 5 positions shown · non-contrast
Comparison: Previous exams.

CLINICAL DATA: Screening.

DIGITAL BILATERAL SCREENING MAMMOGRAM WITH CAD

[R MLO]
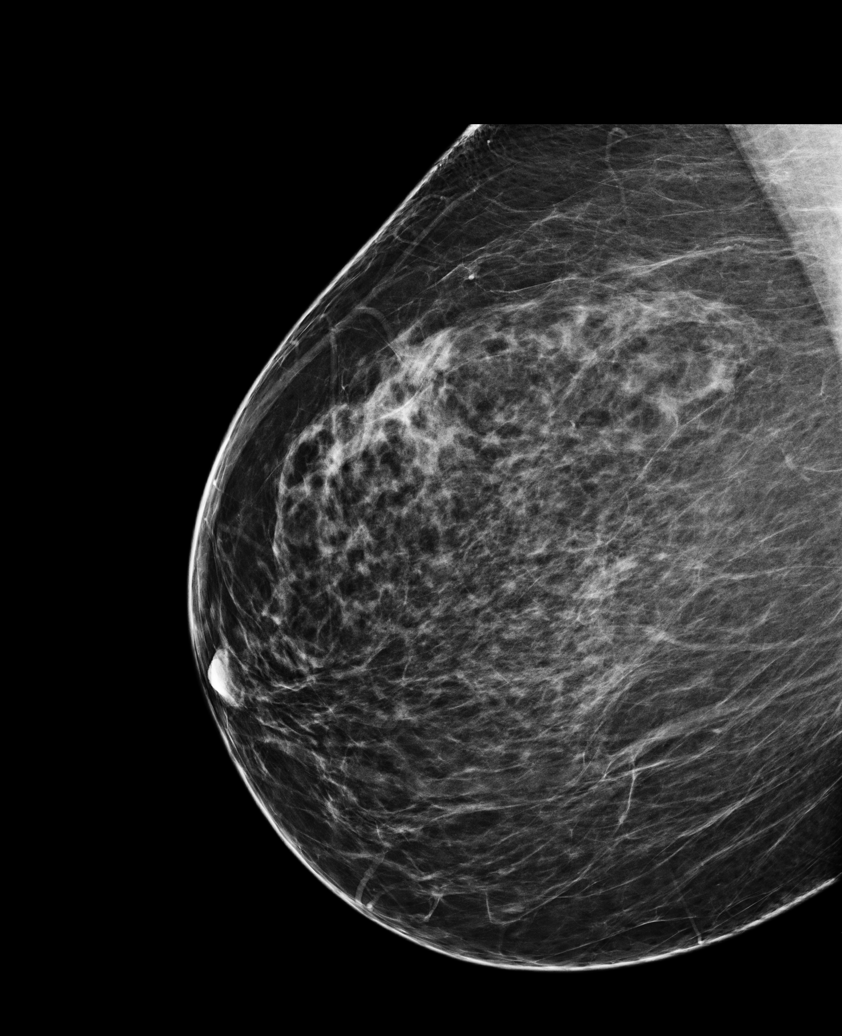

[L MLO]
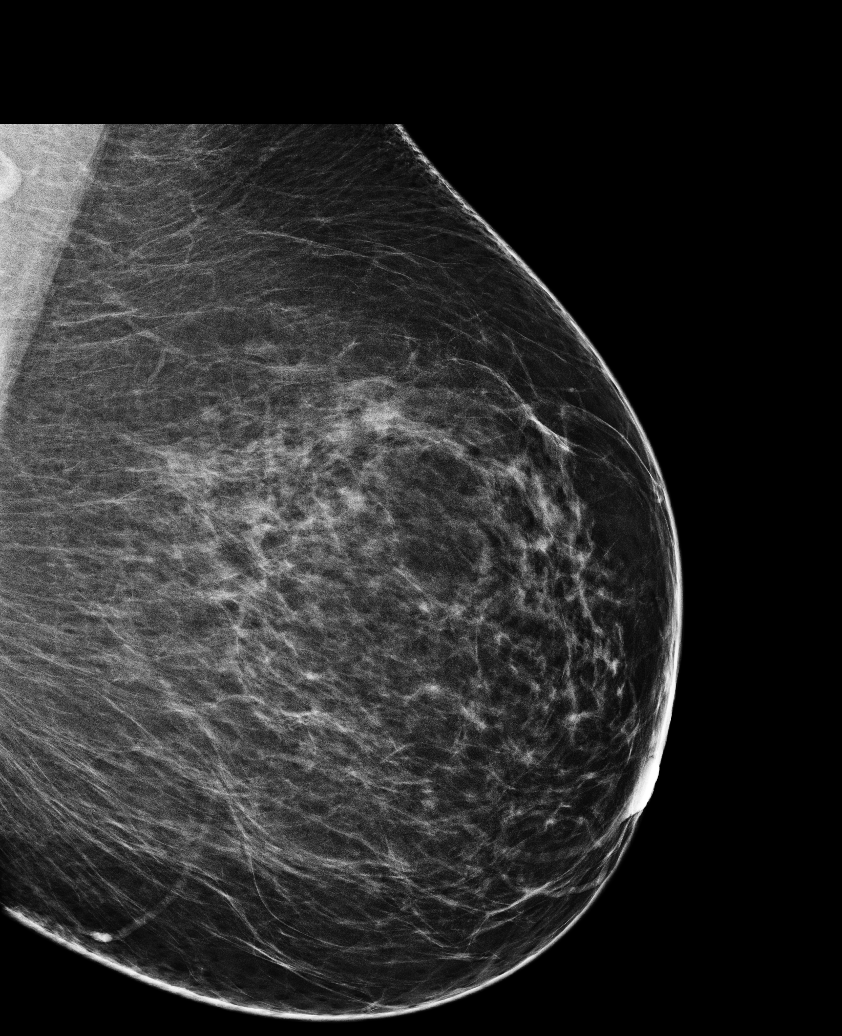

[L CC (1 of 2)]
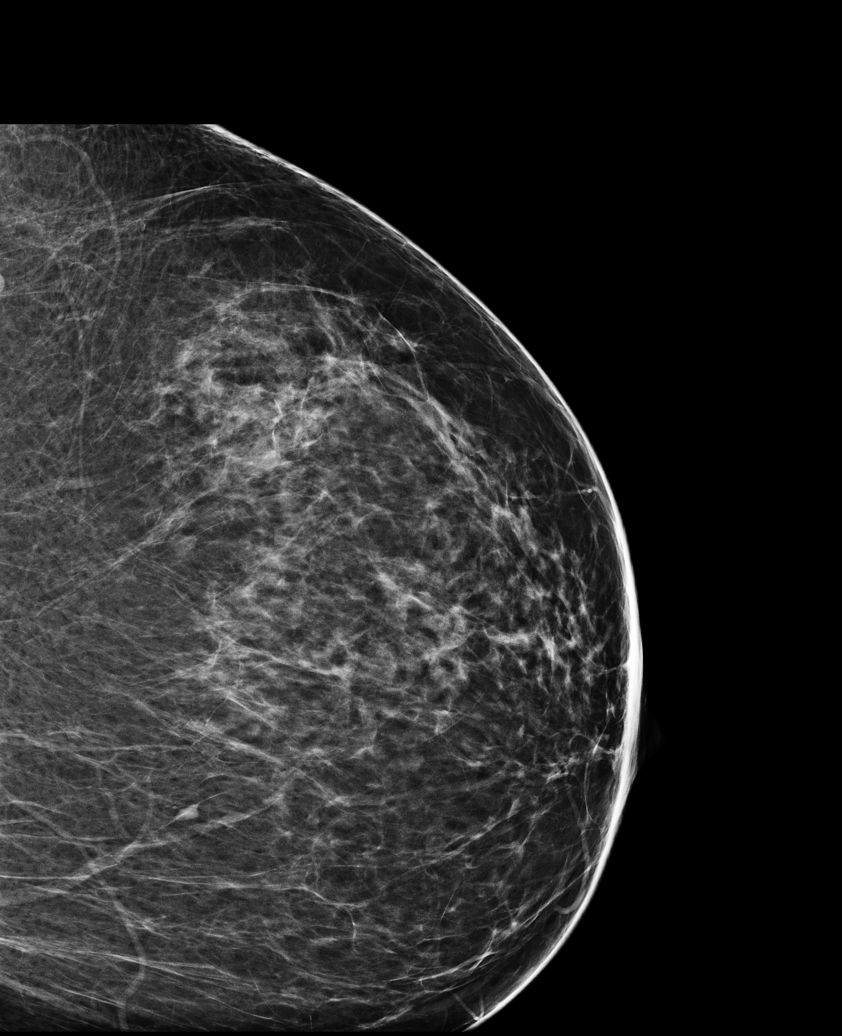

[L CC (2 of 2)]
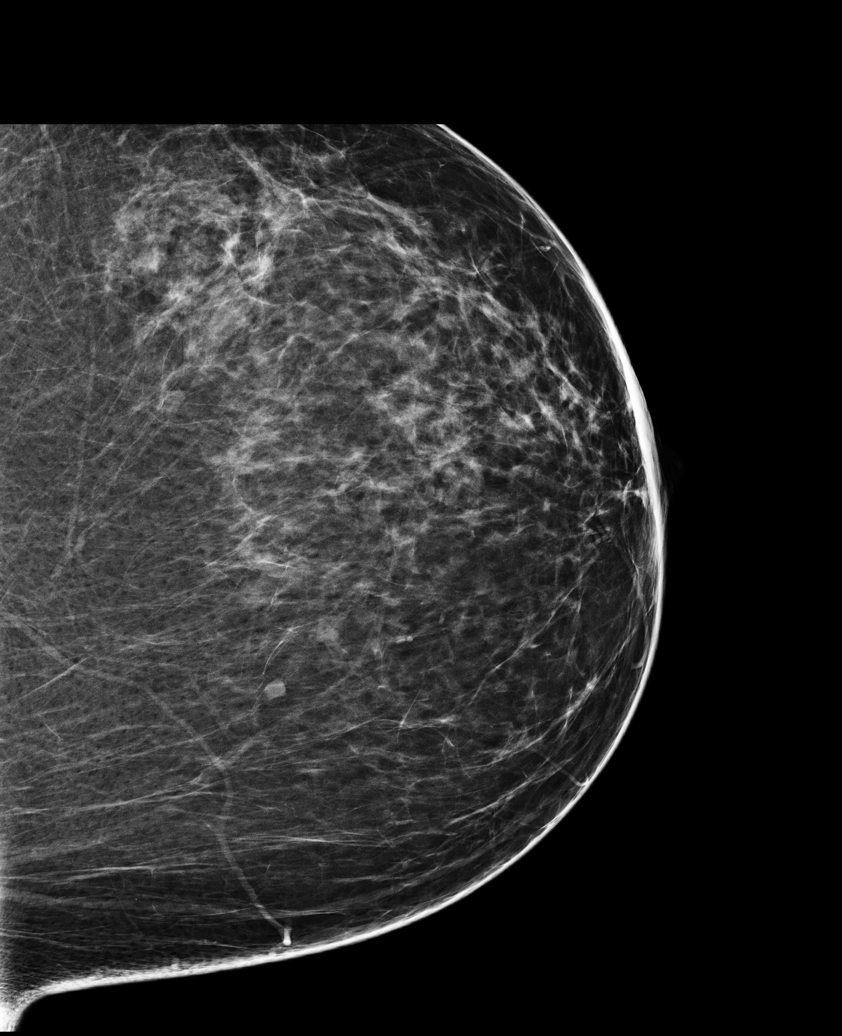

[R CC]
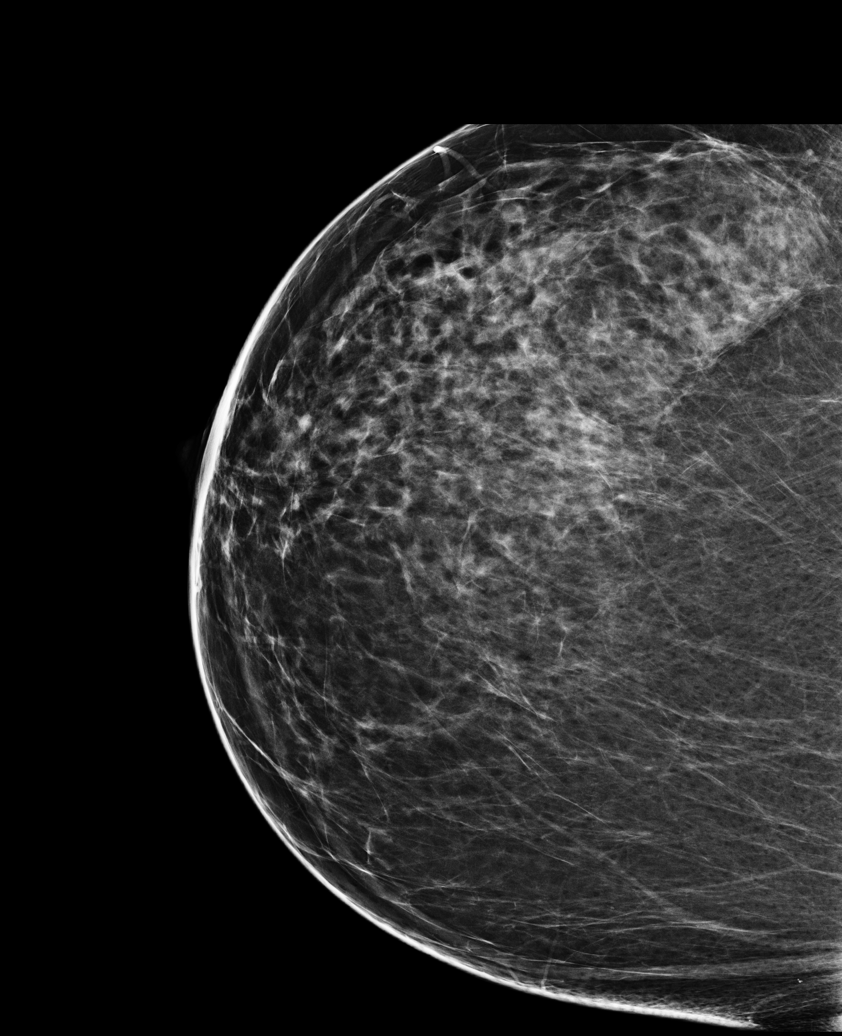

[5 of 5 positions shown; findings below may reference images not displayed]

FINDINGS: ACR Breast Density Category 3: The breast tissue is heterogeneously
dense.

No suspicious masses, architectural distortion, or calcifications
are present.

Images were processed with CAD.
IMPRESSION: No mammographic evidence of malignancy.

A result letter of this screening mammogram will be mailed directly
to the patient.

RECOMMENDATION:
Screening mammogram in one year. (Code:S5-2-VZT)

BI-RADS CATEGORY 1:  Negative.

## 2015-01-14 ENCOUNTER — Other Ambulatory Visit: Payer: Self-pay

## 2015-01-14 DIAGNOSIS — Z1231 Encounter for screening mammogram for malignant neoplasm of breast: Secondary | ICD-10-CM

## 2015-02-23 ENCOUNTER — Ambulatory Visit: Payer: Self-pay

## 2015-07-08 ENCOUNTER — Ambulatory Visit
Admission: RE | Admit: 2015-07-08 | Discharge: 2015-07-08 | Disposition: A | Discharge status: Home / Self Care | Payer: Commercial Managed Care - HMO | Source: Ambulatory Visit | Attending: Internal Medicine | Admitting: Internal Medicine

## 2015-07-08 ENCOUNTER — Other Ambulatory Visit: Payer: Self-pay | Admitting: Internal Medicine

## 2015-07-08 DIAGNOSIS — R200 Anesthesia of skin: Secondary | ICD-10-CM

## 2016-05-17 ENCOUNTER — Other Ambulatory Visit: Payer: Self-pay | Admitting: Internal Medicine

## 2016-05-17 DIAGNOSIS — R200 Anesthesia of skin: Secondary | ICD-10-CM

## 2016-05-18 ENCOUNTER — Ambulatory Visit
Admission: RE | Admit: 2016-05-18 | Discharge: 2016-05-18 | Disposition: A | Discharge status: Home / Self Care | Payer: Commercial Managed Care - HMO | Source: Ambulatory Visit | Attending: Internal Medicine | Admitting: Internal Medicine

## 2016-05-18 DIAGNOSIS — R200 Anesthesia of skin: Secondary | ICD-10-CM

## 2016-06-16 ENCOUNTER — Encounter: Payer: Self-pay | Admitting: Neurology

## 2016-06-16 ENCOUNTER — Telehealth: Payer: Self-pay | Admitting: *Deleted

## 2016-06-16 ENCOUNTER — Ambulatory Visit (INDEPENDENT_AMBULATORY_CARE_PROVIDER_SITE_OTHER): Payer: Commercial Managed Care - HMO | Admitting: Neurology

## 2016-06-16 VITALS — BP 145/90 | HR 95 | Ht 64.0 in | Wt 257.0 lb

## 2016-06-16 DIAGNOSIS — R202 Paresthesia of skin: Secondary | ICD-10-CM | POA: Diagnosis not present

## 2016-06-16 NOTE — Telephone Encounter (Signed)
Called Dr Cleotilde Neer office and spoke with [facility]. She transferred me to medical records. Spoke with Helene Kelp. She will fax report of EMG/NCS completed by Dr Assunta Curtis to [phone].

## 2016-06-16 NOTE — Telephone Encounter (Signed)
Received results via fax. Gave to CW,MD for review.

## 2016-06-16 NOTE — Progress Notes (Signed)
Reason for visit: Arm numbness  Referring physician: [name] is a 46 y.o. female  History of present illness:  Michelle Green is a 46 year old right-handed white female with a history of morbid obesity and pseudotumor cerebri, followed through this office in the past. She returns to this office today for evaluation of numbness in both arms. The patient claims the symptoms have been present for about one year, and are generally occurring only at nighttime. The patient will wake up with her arms numb and tingly, she believes the entire arm is involved. She will shake out the arms and the symptoms go away. She is able to get back to sleep. The patient has worn wrist splints without much benefit. She has eventually undergone EMG and nerve conduction study evaluation, the results of this are not available to me. The patient was told that she had carpal tunnel syndrome, and ulnar neuropathy. The patient may have numbness in the fourth and fifth fingers of the hands. She will note that when she drives a car she also develops numbness in the hands. She denies any weakness of the extremities, she is not dropping things. She denies any neck pain, but she may have some shoulder discomfort. She may have some achy pain in the elbows as well. She denies any numbness or burning sensations or tingling in the feet. She denies any balance changes or difficulty controlling the bowels or the bladder. She has undergone MRI of the cervical spine that shows rightward disc bulges at the C4-5 and C5-6 levels, no definite nerve root impingement was noted. She is sent to this office for further evaluation.    Past Medical History:  Diagnosis Date  . Apnea   . Asthma   . Bronchitis   . Cancer ([place])    thyroid  . Cervical lymphadenopathy   . Depression    mild  . Generalized headaches   . Heart murmur    previoulsy until nov 1996  . Hyperlipidemia   . Insomnia   . Kidney stone 10/2010  . Morbid  obesity ([place])   . Nocturia   . Non-restorative sleep   . Pneumonia   . Pseudotumor cerebri   . Sinus problem   . Snoring   . SOB (shortness of breath)   . Swollen lymph nodes   . Thyroid disease    Cancer    Past Surgical History:  Procedure Laterality Date  . [place], 2001  . CHOLECYSTECTOMY    . HERNIA REPAIR  1998  . lymphnode biposy     left posterior cervical 08/27/2010  . 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 THYROIDECTOMY  2003  . TUBAL LIGATION     Confirm date with patient  . uterine ablation      Family History  Problem Relation Age of Onset  . Adopted: Yes  . Bladder Cancer Mother     Social history:  reports that she quit smoking about 5 years ago. Her smoking use included Cigarettes. She has never used smokeless tobacco. She reports that she drinks alcohol. She reports that she does not use drugs.  Medications:  Prior to Admission medications   Medication Sig Start Date End Date Taking? Authorizing Provider  albuterol (VENTOLIN HFA) 108 (90 BASE) MCG/ACT inhaler Inhale 2 puffs into the lungs every 4 (four) hours as needed. For wheezing and shortness of breath. 04/13/12  Yes Theda Sers, PA-C  levothyroxine (SYNTHROID,  LEVOTHROID) 137 MCG tablet Take 137 mcg by mouth 2 (two) times daily.   Yes Historical Provider, MD      Allergies  Allergen Reactions  . Penicillins Hives    Confirm with patient severity and reaction. Not indicated on history form dated 08/16/10.    ROS:  Out of a complete 14 system review of symptoms, the patient complains only of the following symptoms, and all other reviewed systems are negative.  Numbness, tingling    Height 5\' 4"  (1.626 m), weight 257 lb (116.6 kg).  Physical Exam  General: The patient is alert and cooperative at the time of the examination. The patient is morbidly obese.  Eyes: Pupils are equal, round, and reactive to light. Discs are flat bilaterally.  Neck: The neck is supple,  no carotid bruits are noted.  Respiratory: The respiratory examination is clear.  Cardiovascular: The cardiovascular examination reveals a regular rate and rhythm, no obvious murmurs or rubs are noted.  Neuromuscular: Range of movement the cervical spine is full.  Skin: Extremities are without significant edema.  Neurologic Exam  Mental status: The patient is alert and oriented x 3 at the time of the examination. The patient has apparent normal recent and remote memory, with an apparently normal attention span and concentration ability.  Cranial nerves: Facial symmetry is present. There is good sensation of the face to pinprick and soft touch bilaterally. The strength of the facial muscles and the muscles to head turning and shoulder shrug are normal bilaterally. Speech is well enunciated, no aphasia or dysarthria is noted. Extraocular movements are full. Visual fields are full. The tongue is midline, and the patient has symmetric elevation of the soft palate. No obvious hearing deficits are noted.  Motor: The motor testing reveals 5 over 5 strength of all 4 extremities. Good symmetric motor tone is noted throughout.  Sensory: Sensory testing is intact to pinprick, soft touch, vibration sensation, and position sense on all 4 extremities, with exception that there may be some decreased pinprick sensation on the left hand relative to the right. No evidence of extinction is noted.  Coordination: Cerebellar testing reveals good finger-nose-finger and heel-to-shin bilaterally.  Gait and station: Gait is normal. Tandem gait is normal. Romberg is negative. No drift is seen.  Reflexes: Deep tendon reflexes are symmetric and normal bilaterally. Toes are downgoing bilaterally.   MRI cervical 05/18/16:  IMPRESSION: 1. Cervical disc degeneration C4-C5 through C6-C7 with small rightward disc herniations, maximal at C5-C6. Associated spinal stenosis at that level with mild flattening of the right  hemi cord but no cord signal abnormality. 2. Relative sparing of the cervical neural foramina. Up to mild right C6 and left C8 nerve level foraminal stenosis.  * MRI scan images were reviewed online. I agree with the written report.    Assessment/Plan:  1. Bilateral arm numbness   The patient is reporting some sensory alteration that is present in both arms up to the shoulders at nighttime. The patient is able to shake out the arms and alleviate the symptoms. Even though the numbness is outside of the median nerve distribution, I suspect that the carpal tunnel syndrome is the likely etiology of her symptoms. The patient also has ulnar neuropathies, I do not have the actual report of the EMG and nerve conduction study. I will obtain the results of this study for my review. I suspect that treatment of the carpal tunnel syndrome will likely alleviate her symptoms, I do not believe that the findings on  the cervical MRI are in anyway related to her current symptoms. She will follow-up through this office if needed. I have asked her to wear athletic elbow pads at night to help extend the arms at the elbow to alleviate the ulnar nerve symptoms.   Addendum: I received the report of the EMG and nerve conduction study done, this reveals mild bilateral carpal tunnel syndrome and mild bilateral ulnar neuropathies. EMG evaluation was normal, done on both arms.   Jill Alexanders MD 06/16/2016 8:33 AM  [facility] [address] [place], [place] [address]  Phone [phone] Fax [phone]

## 2016-11-18 ENCOUNTER — Other Ambulatory Visit: Payer: Self-pay | Admitting: Obstetrics and Gynecology

## 2016-11-18 DIAGNOSIS — Z1231 Encounter for screening mammogram for malignant neoplasm of breast: Secondary | ICD-10-CM

## 2016-11-29 ENCOUNTER — Ambulatory Visit
Admission: RE | Admit: 2016-11-29 | Discharge: 2016-11-29 | Disposition: A | Discharge status: Home / Self Care | Payer: 59 | Source: Ambulatory Visit | Attending: Obstetrics and Gynecology | Admitting: Obstetrics and Gynecology

## 2016-11-29 DIAGNOSIS — Z1231 Encounter for screening mammogram for malignant neoplasm of breast: Secondary | ICD-10-CM

## 2017-07-05 ENCOUNTER — Telehealth: Payer: Self-pay | Admitting: Cardiovascular Disease

## 2017-07-05 NOTE — Telephone Encounter (Signed)
Received records from [facility] on 07/05/17, Appt 08/14/17 @ 1:40pm. NV

## 2017-08-14 ENCOUNTER — Ambulatory Visit: Payer: 59 | Admitting: Cardiovascular Disease

## 2017-08-14 ENCOUNTER — Encounter (INDEPENDENT_AMBULATORY_CARE_PROVIDER_SITE_OTHER): Payer: Self-pay

## 2017-08-14 ENCOUNTER — Encounter: Payer: Self-pay | Admitting: Cardiovascular Disease

## 2017-08-14 VITALS — BP 144/89 | HR 92 | Ht 64.0 in | Wt 249.2 lb

## 2017-08-14 DIAGNOSIS — I10 Essential (primary) hypertension: Secondary | ICD-10-CM

## 2017-08-14 DIAGNOSIS — R002 Palpitations: Secondary | ICD-10-CM | POA: Diagnosis not present

## 2017-08-14 NOTE — Progress Notes (Signed)
Cardiology Office Note   Date:  08/14/2017   ID:  Michelle Green, DOB [birth year], MRN [id number]  PCP:  Michelle Carol, MD  Cardiologist:   Michelle Latch, MD  OB/GYN: Dr. Everlene Green   Chief Complaint  Patient presents with  . New Patient (Initial Visit)  . Dizziness    frequently  . Edema    hands and feet      History of Present Illness: Michelle Green is a 47 y.o. female with hypertension and thyroid cancer s/p thyroidectomy who is being seen today for the evaluation of palpitations at the request of Dr. Everlene Green.  Michelle Green reports having palpitations for the last few months.  They seem to be worse in the evenings.  It typically occurs when she is at rest.  Her heart races for up to an hour at a time.  There is no associated chest pain or shortness of breath.  She does get lightheaded but denies syncope.  She drinks one cup of coffee daily and has no supplements or over the counter cold meds.  She is very active but does not get much formal exercise.  She has no exertional symptoms with walking upstairs.  She is been able to lose 30 pounds by participating in [employer].  She gets approximately 7-10,000 steps daily.  She notes that her hands and feet are both "puffy" by the end of the day.  This improves with elevation.  She denies orthopnea or PND.  She was told that she had a heart murmur as a child.  She had an echocardiogram in pregnancy and had no valvular heart disease.  She had similar symptoms in the past and wore a 24-hour Holter that was reportedly unremarkable.     Past Medical History:  Diagnosis Date  . Apnea   . Asthma   . Bronchitis   . Cancer ([place])    thyroid  . Cervical lymphadenopathy   . Depression    mild  . Generalized headaches   . Heart murmur    previoulsy until nov 1996  . Hyperlipidemia   . Insomnia   . Kidney stone 10/2010  . Morbid obesity ([place])   . Nocturia   . Non-restorative sleep   . Pneumonia   . Pseudotumor cerebri     . Sinus problem   . Snoring   . SOB (shortness of breath)   . Swollen lymph nodes   . Thyroid disease    Cancer    Past Surgical History:  Procedure Laterality Date  . [place], 2001  . CHOLECYSTECTOMY    . HERNIA REPAIR  1998  . lymphnode biposy     left posterior cervical 08/27/2010  . 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 THYROIDECTOMY  2003  . TUBAL LIGATION     Confirm date with patient  . uterine ablation       Current Outpatient Medications  Medication Sig Dispense Refill  . albuterol (VENTOLIN HFA) 108 (90 BASE) MCG/ACT inhaler Inhale 2 puffs into the lungs every 4 (four) hours as needed. For wheezing and shortness of breath. 6.7 g 0  . amLODipine (NORVASC) 5 MG tablet Take 5 mg by mouth daily.    . Fluticasone-Salmeterol (ADVAIR DISKUS) 100-50 MCG/DOSE AEPB Take 1 puff by mouth 2 (two) times daily.    Marland Kitchen levothyroxine (SYNTHROID, LEVOTHROID) 137 MCG tablet Take 137 mcg by mouth 2 (two) times daily.    . Vitamin  D, Ergocalciferol, (DRISDOL) 50000 units CAPS capsule Take 50,000 Units by mouth once a week.  6  . sertraline (ZOLOFT) 50 MG tablet Take 50 mg by mouth daily.  10   No current facility-administered medications for this visit.     Allergies:   Penicillins    Social History:  The patient  reports that she quit smoking about 6 years ago. Her smoking use included cigarettes. She has never used smokeless tobacco. She reports that she drinks alcohol. She reports that she does not use drugs.   Family History:  The patient's family history includes Bladder Cancer in her mother. She was adopted.    ROS:  Please see the history of present illness.   Otherwise, review of systems are positive for none.   All other systems are reviewed and negative.    PHYSICAL EXAM: VS:  BP (!) 144/89   Pulse 92   Ht 5\' 4"  (1.626 m)   Wt 249 lb 3.2 oz (113 kg)   BMI 42.78 kg/m  , BMI Body mass index is 42.78 kg/m. GENERAL:  Well appearing.  No acute  distress.  HEENT:  Pupils equal round and reactive, fundi not visualized, oral mucosa unremarkable NECK:  No jugular venous distention, waveform within normal limits, carotid upstroke brisk and symmetric, no bruits LUNGS:  Clear to auscultation bilaterally HEART:  RRR.  PMI not displaced or sustained,S1 and S2 within normal limits, no S3, no S4, no clicks, no rubs, no murmurs ABD:  Flat, positive bowel sounds normal in frequency in pitch, no bruits, no rebound, no guarding, no midline pulsatile mass, no hepatomegaly, no splenomegaly EXT:  2 plus pulses throughout, no edema, no cyanosis no clubbing SKIN:  No rashes no nodules NEURO:  Cranial nerves II through XII grossly intact, motor grossly intact throughout PSYCH:  Cognitively intact, oriented to person place and time  EKG:  EKG is ordered today. The ekg ordered today demonstrates sinus rhythm.  Rate 92 bpm.     Recent Labs: No results found for requested labs within last 8760 hours.    Lipid Panel No results found for: CHOL, TRIG, HDL, CHOLHDL, VLDL, LDLCALC, LDLDIRECT    Wt Readings from Last 3 Encounters:  08/14/17 249 lb 3.2 oz (113 kg)  06/16/16 257 lb (116.6 kg)  08/10/12 260 lb (117.9 kg)      ASSESSMENT AND PLAN:  # Palpitations:  Likely PACs or PVCs.  However it seems that she also has some sustained arrhythmias.  7-day event monitor to evaluate.  Also check CBC, CMP, magnesium, TSH and free T4.  # Hypertension: BP above goal.  She reports it has been controlled at home and at other appointments.  Continue amlodipine.  Consider adding/switching to beta blocker based on event monitor results.   # Morbid Obesity: # CV Disease Prevention:  We will get a copy of her lipids from her PCP.      Current medicines are reviewed at length with the patient today.  The patient does not have concerns regarding medicines.  The following changes have been made:  no change  Labs/ tests ordered today include:   Orders Placed  This Encounter  Procedures  . T4, free  . TSH  . Comprehensive metabolic panel  . Magnesium  . CBC with Differential/Platelet  . CARDIAC EVENT MONITOR  . EKG 12-Lead     Disposition:   FU with Michelle Meadow C. Oval Linsey, MD, [facility] in 4 months.  APP in 1 month.  Signed, Hawley Michel C. Oval Linsey, MD, [facility]  08/14/2017 4:27 PM    [facility]

## 2017-08-14 NOTE — Patient Instructions (Addendum)
Medication Instructions:  Your physician recommends that you continue on your current medications as directed. Please refer to the Current Medication list given to you today.  Labwork: CMET/CBC/MAGNESIUM/TSH/FT4 TODAY   Testing/Procedures: Your physician has recommended that you wear an event monitor. Event monitors are medical devices that record the heart's electrical activity. Doctors most often [place] these monitors to diagnose arrhythmias. Arrhythmias are problems with the speed or rhythm of the heartbeat. The monitor is a small, portable device. You can wear one while you do your normal daily activities. This is usually used to diagnose what is causing palpitations/syncope (passing out). [place]  Follow-Up: Your physician recommends that you schedule a follow-up appointment in: Time  Your physician recommends that you schedule a follow-up appointment in: [facility]    If you need a refill on your cardiac medications before your next appointment, please call your pharmacy.   Cardiac Event Monitoring A cardiac event monitor is a small recording device that is used to detect abnormal heart rhythms (arrhythmias). The monitor is used to record your heart rhythm when you have symptoms, such as:  Fast heartbeats (palpitations), such as heart racing or fluttering.  Dizziness.  Fainting or light-headedness.  Unexplained weakness.  Some monitors are wired to electrodes placed on your chest. Electrodes are flat, sticky disks that attach to your skin. Other monitors may be hand-held or worn on the wrist. The monitor can be worn for up to 30 days. If the monitor is attached to your chest, a technician will prepare your chest for the electrode placement and show you how to work the monitor. Take time to practice using the monitor before you leave the office. Make sure you understand how to send the information from the monitor  to your health care provider. In some cases, you may need to use a landline telephone instead of a cell phone. What are the risks? Generally, this device is safe to use, but it possible that the skin under the electrodes will become irritated. How to use your cardiac event monitor  Wear your monitor at all times, except when you are in water: ? Do not let the monitor get wet. ? Take the monitor off when you bathe. Do not swim or use a hot tub with it on.  Keep your skin clean. Do not put body lotion or moisturizer on your chest.  Change the electrodes as told by your health care provider or any time they stop sticking to your skin. You may need to use medical tape to keep them on.  Try to put the electrodes in slightly different places on your chest to help prevent skin irritation. They must remain in the area under your left breast and in the upper right section of your chest.  Make sure the monitor is safely clipped to your clothing or in a location close to your body that your health care provider recommends.  Press the button to record as soon as you feel heart-related symptoms, such as: ? Dizziness. ? Weakness. ? Light-headedness. ? Palpitations. ? Thumping or pounding in your chest. ? Shortness of breath. ? Unexplained weakness.  Keep a diary of your activities, such as walking, doing chores, and taking medicine. It is very important to note what you were doing when you pushed the button to record your symptoms. This will help your health care provider determine what might be contributing to  your symptoms.  Send the recorded information as recommended by your health care provider. It may take some time for your health care provider to process the results.  Change the batteries as told by your health care provider.  Keep electronic devices away from your monitor. This includes: ? Tablets. ? MP3 players. ? Cell phones.  While wearing your monitor you should avoid: ? Electric  blankets. ? Armed forces operational officer. ? Electric toothbrushes. ? Microwave ovens. ? Magnets. ? Metal detectors. Get help right away if:  You have chest pain.  You have extreme difficulty breathing or shortness of breath.  You develop a very fast heartbeat that persists.  You develop dizziness that does not go away.  You faint or constantly feel like you are about to faint. Summary  A cardiac event monitor is a small recording device that is used to help detect abnormal heart rhythms (arrhythmias).  The monitor is used to record your heart rhythm when you have heart-related symptoms.  Make sure you understand how to send the information from the monitor to your health care provider.  It is important to press the button on the monitor when you have any heart-related symptoms.  Keep a diary of your activities, such as walking, doing chores, and taking medicine. It is very important to note what you were doing when you pushed the button to record your symptoms. This will help your health care provider learn what might be causing your symptoms. This information is not intended to replace advice given to you by your health care provider. Make sure you discuss any questions you have with your health care provider. Document Released: 01/05/2008 Document Revised: 03/12/2016 Document Reviewed: 03/12/2016 Elsevier Interactive Patient Education  2017 [employer].

## 2017-08-16 LAB — CBC WITH DIFFERENTIAL/PLATELET
Basophils Absolute: 0 10*3/uL (ref 0.0–0.2)
Basos: 0 %
EOS (ABSOLUTE): 0.1 10*3/uL (ref 0.0–0.4)
Eos: 1 %
Hematocrit: 41.7 % (ref 34.0–46.6)
Hemoglobin: 14 g/dL (ref 11.1–15.9)
Immature Grans (Abs): 0 10*3/uL (ref 0.0–0.1)
Immature Granulocytes: 0 %
LYMPHS ABS: 3 10*3/uL (ref 0.7–3.1)
Lymphs: 21 %
MCH: 28.2 pg (ref 26.6–33.0)
MCHC: 33.6 g/dL (ref 31.5–35.7)
MCV: 84 fL (ref 79–97)
MONOCYTES: 5 %
MONOS ABS: 0.7 10*3/uL (ref 0.1–0.9)
NEUTROS ABS: 10.3 10*3/uL — AB (ref 1.4–7.0)
Neutrophils: 73 %
Platelets: 316 10*3/uL (ref 150–379)
RBC: 4.96 x10E6/uL (ref 3.77–5.28)
RDW: 14.2 % (ref 12.3–15.4)
WBC: 14.2 10*3/uL — AB (ref 3.4–10.8)

## 2017-08-16 LAB — COMPREHENSIVE METABOLIC PANEL
ALBUMIN: 4.2 g/dL (ref 3.5–5.5)
ALT: 21 IU/L (ref 0–32)
AST: 18 IU/L (ref 0–40)
Albumin/Globulin Ratio: 1.6 (ref 1.2–2.2)
Alkaline Phosphatase: 89 IU/L (ref 39–117)
BUN / CREAT RATIO: 22 (ref 9–23)
BUN: 16 mg/dL (ref 6–24)
Bilirubin Total: 0.3 mg/dL (ref 0.0–1.2)
CO2: 23 mmol/L (ref 20–29)
CREATININE: 0.72 mg/dL (ref 0.57–1.00)
Calcium: 9.2 mg/dL (ref 8.7–10.2)
Chloride: 100 mmol/L (ref 96–106)
GFR calc non Af Amer: 101 mL/min/{1.73_m2} (ref >59)
GFR, EST AFRICAN AMERICAN: 116 mL/min/{1.73_m2} (ref >59)
GLOBULIN, TOTAL: 2.6 g/dL (ref 1.5–4.5)
Glucose: 99 mg/dL (ref 65–99)
Potassium: 4.6 mmol/L (ref 3.5–5.2)
SODIUM: 139 mmol/L (ref 134–144)
TOTAL PROTEIN: 6.8 g/dL (ref 6.0–8.5)

## 2017-08-16 LAB — T4, FREE: Free T4: 1.79 ng/dL — ABNORMAL HIGH (ref 0.82–1.77)

## 2017-08-16 LAB — TSH: TSH: 0.006 u[IU]/mL — ABNORMAL LOW (ref 0.450–4.500)

## 2017-08-16 LAB — MAGNESIUM: Magnesium: 1.9 mg/dL (ref 1.6–2.3)

## 2017-08-23 ENCOUNTER — Ambulatory Visit (INDEPENDENT_AMBULATORY_CARE_PROVIDER_SITE_OTHER): Payer: 59

## 2017-08-23 DIAGNOSIS — R002 Palpitations: Secondary | ICD-10-CM

## 2017-09-06 ENCOUNTER — Telehealth: Payer: Self-pay | Admitting: Cardiovascular Disease

## 2017-09-06 NOTE — Telephone Encounter (Signed)
New Message:      Pt is calling to get some results.

## 2017-09-06 NOTE — Telephone Encounter (Signed)
Patient informed and verbalized understanding. Copy sent to PCP 

## 2017-09-06 NOTE — Telephone Encounter (Signed)
Result Notes for T4, free   Notes recorded by Alvina Filbert B on 09/05/2017 at 5:49 PM EDT Left message to call back ------  Notes recorded by Skeet Latch, MD on 09/01/2017 at 4:31 PM EDT Labs show that she is hyperthyroid. Follow up with PCP to adjust her levothyroxine. This is probably why she has palpitations. Normal kidney function, liver function and electrolytes.

## 2017-09-18 ENCOUNTER — Ambulatory Visit: Payer: 59 | Admitting: Physician Assistant

## 2017-09-18 ENCOUNTER — Encounter: Payer: Self-pay | Admitting: Physician Assistant

## 2017-09-18 VITALS — BP 138/82 | HR 97 | Ht 64.0 in | Wt 246.6 lb

## 2017-09-18 DIAGNOSIS — I493 Ventricular premature depolarization: Secondary | ICD-10-CM | POA: Diagnosis not present

## 2017-09-18 DIAGNOSIS — I10 Essential (primary) hypertension: Secondary | ICD-10-CM

## 2017-09-18 DIAGNOSIS — E039 Hypothyroidism, unspecified: Secondary | ICD-10-CM

## 2017-09-18 DIAGNOSIS — R002 Palpitations: Secondary | ICD-10-CM

## 2017-09-18 NOTE — Progress Notes (Signed)
Cardiology Office Note   Date:  09/18/2017   ID:  Michelle Green, DOB Dec 18, 1970, MRN [id number]  PCP:  Seward Carol, MD  Cardiologist: Dr. Oval Linsey, 08/14/2017 Rosaria Ferries, PA-C   Chief Complaint  Patient presents with  . Follow-up    History of Present Illness: Michelle Green is a 46 y.o. female with a history of HTN, thyroid CA s/p thyroidectomy, obesity>>WW>>30 lbs gone, SEM as a child (years later was told the hole closed).  05/06 office visit for palpitations, s/p 7 day event monitor  Michelle Green presents for cardiology follow up.   She feels the palpitations when they occur and they occur multiple times every day.  They do not give her chest pain or shortness of breath.  She does not get dizzy or lightheaded.   The palpitations are just concerning to her because she can feel them.  She is relieved to learn that they are not harmful to her.  Since her thyroid surgery, she has worked with Dr. Forde Dandy to get her thyroid medications adjusted.  They have been changed multiple times.  She has not seen Dr. Forde Dandy since her TSH and free T4 were checked in May.   Past Medical History:  Diagnosis Date  . Apnea   . Asthma   . Bronchitis   . Cancer ([place])    thyroid  . Cervical lymphadenopathy   . Depression    mild  . Generalized headaches   . Heart murmur    previoulsy until nov 1996  . Hyperlipidemia   . Insomnia   . Kidney stone 10/2010  . Morbid obesity ([place])   . Nocturia   . Non-restorative sleep   . Pneumonia   . Pseudotumor cerebri   . Sinus problem   . Snoring   . SOB (shortness of breath)   . Swollen lymph nodes   . Thyroid disease    Cancer    Past Surgical History:  Procedure Laterality Date  . [place], 2001  . CHOLECYSTECTOMY    . HERNIA REPAIR  1998  . lymphnode biposy     left posterior cervical 08/27/2010  . 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 THYROIDECTOMY  2003  . TUBAL LIGATION     Confirm date with  patient  . uterine ablation      Current Outpatient Medications  Medication Sig Dispense Refill  . albuterol (VENTOLIN HFA) 108 (90 BASE) MCG/ACT inhaler Inhale 2 puffs into the lungs every 4 (four) hours as needed. For wheezing and shortness of breath. 6.7 g 0  . amLODipine (NORVASC) 5 MG tablet Take 5 mg by mouth daily.    . Fluticasone-Salmeterol (ADVAIR DISKUS) 100-50 MCG/DOSE AEPB Take 1 puff by mouth 2 (two) times daily.    Marland Kitchen levothyroxine (SYNTHROID, LEVOTHROID) 137 MCG tablet Take 137 mcg by mouth 2 (two) times daily.    . sertraline (ZOLOFT) 50 MG tablet Take 50 mg by mouth daily.  10  . Vitamin D, Ergocalciferol, (DRISDOL) 50000 units CAPS capsule Take 50,000 Units by mouth once a week.  6   No current facility-administered medications for this visit.     Allergies:   Penicillins    Social History:  The patient  reports that she quit smoking about 6 years ago. Her smoking use included cigarettes. She has never used smokeless tobacco. She reports that she drinks alcohol. She reports that she does not use drugs.   Family History:  The patient's family history includes Bladder Cancer in her mother. She was adopted.    ROS:  Please see the history of present illness. All other systems are reviewed and negative.    PHYSICAL EXAM: VS:  BP 138/82   Pulse 97   Ht 5\' 4"  (1.626 m)   Wt 246 lb 9.6 oz (111.9 kg)   BMI 42.33 kg/m  , BMI Body mass index is 42.33 kg/m. GEN: Well nourished, well developed, female in no acute distress  HEENT: normal for age  Neck: no JVD, no carotid bruit, no masses Cardiac: RRR; soft murmur, no rubs, or gallops Respiratory:  clear to auscultation bilaterally, normal work of breathing GI: soft, nontender, nondistended, + BS MS: no deformity or atrophy; no edema; distal pulses are 2+ in all 4 extremities   Skin: warm and dry, no rash Neuro:  Strength and sensation are intact Psych: euthymic mood, full affect   EKG:  EKG is not ordered  today.   Recent Labs: 08/14/2017: ALT 21; BUN 16; Creatinine, Ser 0.72; Hemoglobin 14.0; Magnesium 1.9; Platelets 316; Potassium 4.6; Sodium 139;   Lab Results  Component Value Date   TSH 0.006 (L) 08/14/2017   Free T4  Date Value Ref Range Status  08/14/2017 1.79 (H) 0.82 - 1.77 ng/dL Final    Wt Readings from Last 3 Encounters:  09/18/17 246 lb 9.6 oz (111.9 kg)  08/14/17 249 lb 3.2 oz (113 kg)  06/16/16 257 lb (116.6 kg)     Other studies Reviewed: Additional studies/ records that were reviewed today include: Office notes and testing.  ASSESSMENT AND PLAN:  1.  Palpitations: She had frequent PVCs and some PACs on the monitor, she was very aware of them.  I explained that I could add a beta-blocker to see if that would help and feel that her blood pressure would tolerate it.  - However, if she wishes to wait until after she sees Dr. Forde Dandy to see if her thyroid medications need adjusting, that is okay.  She wishes to do this.  2.  Acquired hypothyroidism: She has been struggling with this since her thyroidectomy.  Her last TSH was very low but her free T4 was only minimally elevated. -Follow-up with Dr. Forde Dandy to see if her thyroid medications need to be adjusted or may be contributing to her palpitations.  3.  Hypertension: Her blood pressure is minimally above goal today -Believe she has room to add a beta-blocker if her symptoms do not improve.   Current medicines are reviewed at length with the patient today.  The patient does not have concerns regarding medicines.  The following changes have been made:  no change  Labs/ tests ordered today include:  No orders of the defined types were placed in this encounter.     Disposition:   FU with Dr. Oval Linsey  Signed, Rosaria Ferries, PA-C  09/18/2017 3:23 PM    [place] Phone: [phone]; Fax: [phone]  This note was written with the assistance of speech recognition software. Please  excuse any transcriptional errors.

## 2017-09-18 NOTE — Patient Instructions (Signed)
Medication Instructions:  Your physician recommends that you continue on your current medications as directed. Please refer to the Current Medication list given to you today.  Labwork: None   Testing/Procedures: None   Follow-Up: Keep follow up appointment as scheduled  Any Other Special Instructions Will Be Listed Below (If Applicable). 1. Follow up with Dr Forde Dandy  If you need a refill on your cardiac medications before your next appointment, please call your pharmacy.

## 2017-09-19 ENCOUNTER — Encounter: Payer: Self-pay | Admitting: Physician Assistant

## 2017-10-16 ENCOUNTER — Other Ambulatory Visit: Payer: Self-pay | Admitting: Obstetrics and Gynecology

## 2017-10-16 DIAGNOSIS — Z1231 Encounter for screening mammogram for malignant neoplasm of breast: Secondary | ICD-10-CM

## 2017-11-30 ENCOUNTER — Ambulatory Visit: Payer: 59

## 2017-12-12 ENCOUNTER — Inpatient Hospital Stay: Admission: RE | Admit: 2017-12-12 | Payer: 59 | Source: Ambulatory Visit

## 2017-12-21 ENCOUNTER — Ambulatory Visit: Payer: 59 | Admitting: Cardiovascular Disease

## 2018-01-24 ENCOUNTER — Ambulatory Visit
Admission: RE | Admit: 2018-01-24 | Discharge: 2018-01-24 | Disposition: A | Discharge status: Home / Self Care | Payer: 59 | Source: Ambulatory Visit | Attending: Obstetrics and Gynecology | Admitting: Obstetrics and Gynecology

## 2018-01-24 DIAGNOSIS — Z1231 Encounter for screening mammogram for malignant neoplasm of breast: Secondary | ICD-10-CM

## 2018-06-12 DIAGNOSIS — J069 Acute upper respiratory infection, unspecified: Secondary | ICD-10-CM | POA: Diagnosis not present

## 2018-06-12 DIAGNOSIS — J029 Acute pharyngitis, unspecified: Secondary | ICD-10-CM | POA: Diagnosis not present

## 2018-06-12 DIAGNOSIS — R05 Cough: Secondary | ICD-10-CM | POA: Diagnosis not present

## 2018-06-12 DIAGNOSIS — I10 Essential (primary) hypertension: Secondary | ICD-10-CM | POA: Diagnosis not present

## 2018-07-03 ENCOUNTER — Emergency Department (HOSPITAL_BASED_OUTPATIENT_CLINIC_OR_DEPARTMENT_OTHER): Payer: BLUE CROSS/BLUE SHIELD

## 2018-07-03 ENCOUNTER — Other Ambulatory Visit: Payer: Self-pay

## 2018-07-03 ENCOUNTER — Encounter (HOSPITAL_BASED_OUTPATIENT_CLINIC_OR_DEPARTMENT_OTHER): Payer: Self-pay | Admitting: *Deleted

## 2018-07-03 ENCOUNTER — Emergency Department (HOSPITAL_BASED_OUTPATIENT_CLINIC_OR_DEPARTMENT_OTHER)
Admission: EM | Admit: 2018-07-03 | Discharge: 2018-07-03 | Disposition: A | Discharge status: Home / Self Care | Payer: BLUE CROSS/BLUE SHIELD | Attending: Emergency Medicine | Admitting: Emergency Medicine

## 2018-07-03 DIAGNOSIS — J069 Acute upper respiratory infection, unspecified: Secondary | ICD-10-CM | POA: Diagnosis not present

## 2018-07-03 DIAGNOSIS — R0602 Shortness of breath: Secondary | ICD-10-CM | POA: Diagnosis not present

## 2018-07-03 DIAGNOSIS — R079 Chest pain, unspecified: Secondary | ICD-10-CM | POA: Insufficient documentation

## 2018-07-03 DIAGNOSIS — R059 Cough, unspecified: Secondary | ICD-10-CM

## 2018-07-03 DIAGNOSIS — R0789 Other chest pain: Secondary | ICD-10-CM | POA: Diagnosis not present

## 2018-07-03 DIAGNOSIS — R05 Cough: Secondary | ICD-10-CM | POA: Diagnosis not present

## 2018-07-03 DIAGNOSIS — R509 Fever, unspecified: Secondary | ICD-10-CM

## 2018-07-03 DIAGNOSIS — Z20828 Contact with and (suspected) exposure to other viral communicable diseases: Secondary | ICD-10-CM | POA: Diagnosis not present

## 2018-07-03 DIAGNOSIS — Z85850 Personal history of malignant neoplasm of thyroid: Secondary | ICD-10-CM | POA: Diagnosis not present

## 2018-07-03 DIAGNOSIS — Z79899 Other long term (current) drug therapy: Secondary | ICD-10-CM | POA: Diagnosis not present

## 2018-07-03 DIAGNOSIS — Z87891 Personal history of nicotine dependence: Secondary | ICD-10-CM | POA: Diagnosis not present

## 2018-07-03 DIAGNOSIS — J45909 Unspecified asthma, uncomplicated: Secondary | ICD-10-CM | POA: Diagnosis not present

## 2018-07-03 LAB — CBC WITH DIFFERENTIAL/PLATELET
Abs Immature Granulocytes: 0.08 10*3/uL — ABNORMAL HIGH (ref 0.00–0.07)
BASOS PCT: 0 %
Basophils Absolute: 0 10*3/uL (ref 0.0–0.1)
EOS ABS: 0 10*3/uL (ref 0.0–0.5)
EOS PCT: 0 %
HEMATOCRIT: 41.5 % (ref 36.0–46.0)
Hemoglobin: 12.9 g/dL (ref 12.0–15.0)
IMMATURE GRANULOCYTES: 1 %
Lymphocytes Relative: 11 %
Lymphs Abs: 1.6 10*3/uL (ref 0.7–4.0)
MCH: 27.6 pg (ref 26.0–34.0)
MCHC: 31.1 g/dL (ref 30.0–36.0)
MCV: 88.9 fL (ref 80.0–100.0)
MONOS PCT: 7 %
Monocytes Absolute: 1.1 10*3/uL — ABNORMAL HIGH (ref 0.1–1.0)
NEUTROS PCT: 81 %
Neutro Abs: 11.4 10*3/uL — ABNORMAL HIGH (ref 1.7–7.7)
PLATELETS: 249 10*3/uL (ref 150–400)
RBC: 4.67 MIL/uL (ref 3.87–5.11)
RDW: 14.5 % (ref 11.5–15.5)
WBC: 14.1 10*3/uL — ABNORMAL HIGH (ref 4.0–10.5)
nRBC: 0 % (ref 0.0–0.2)

## 2018-07-03 LAB — COMPREHENSIVE METABOLIC PANEL
ALT: 24 U/L (ref 0–44)
ANION GAP: 9 (ref 5–15)
AST: 19 U/L (ref 15–41)
Albumin: 3.3 g/dL — ABNORMAL LOW (ref 3.5–5.0)
Alkaline Phosphatase: 70 U/L (ref 38–126)
BILIRUBIN TOTAL: 0.6 mg/dL (ref 0.3–1.2)
BUN: 8 mg/dL (ref 6–20)
CHLORIDE: 101 mmol/L (ref 98–111)
CO2: 25 mmol/L (ref 22–32)
Calcium: 8.3 mg/dL — ABNORMAL LOW (ref 8.9–10.3)
Creatinine, Ser: 0.8 mg/dL (ref 0.44–1.00)
GFR calc Af Amer: >60 mL/min (ref >60)
Glucose, Bld: 120 mg/dL — ABNORMAL HIGH (ref 70–99)
POTASSIUM: 3 mmol/L — AB (ref 3.5–5.1)
Sodium: 135 mmol/L (ref 135–145)
TOTAL PROTEIN: 7.2 g/dL (ref 6.5–8.1)

## 2018-07-03 LAB — LACTIC ACID, PLASMA: LACTIC ACID, VENOUS: 0.9 mmol/L (ref 0.5–1.9)

## 2018-07-03 LAB — LIPASE, BLOOD: LIPASE: 22 U/L (ref 11–51)

## 2018-07-03 LAB — D-DIMER, QUANTITATIVE (NOT AT ARMC): D DIMER QUANT: 0.96 ug{FEU}/mL — AB (ref 0.00–0.50)

## 2018-07-03 LAB — TROPONIN I

## 2018-07-03 MED ORDER — POTASSIUM CHLORIDE CRYS ER 20 MEQ PO TBCR
40.0000 meq | EXTENDED_RELEASE_TABLET | Freq: Once | ORAL | Status: AC
  Administered 2018-07-03: 40 meq via ORAL
  Filled 2018-07-03: qty 2

## 2018-07-03 MED ORDER — SODIUM CHLORIDE 0.9 % IV BOLUS
1000.0000 mL | Freq: Once | INTRAVENOUS | Status: AC
  Administered 2018-07-03: 1000 mL via INTRAVENOUS

## 2018-07-03 MED ORDER — IOPAMIDOL (ISOVUE-370) INJECTION 76%
100.0000 mL | Freq: Once | INTRAVENOUS | Status: AC | PRN
  Administered 2018-07-03: 100 mL via INTRAVENOUS

## 2018-07-03 NOTE — ED Notes (Signed)
Provided CDC/[place] dept guidelines for COVID 19 self quarantine. Patient verbally acknowledges instructions and signed quarantine form.

## 2018-07-03 NOTE — ED Provider Notes (Signed)
[facility] Provider Note   CSN: [id number] Arrival date & time: 07/03/18  1742    History   Chief Complaint Chief Complaint  Patient presents with   Fever    HPI Michelle Green is a 48 y.o. female.     The history is provided by the patient and medical records. No language interpreter was used.  Fever  Max temp prior to arrival:  102.5 Temp source:  Oral Severity:  Severe Onset quality:  Gradual Duration:  2 days Timing:  Intermittent Progression:  Waxing and waning Chronicity:  New Relieved by:  Acetaminophen and ibuprofen Worsened by:  Nothing Ineffective treatments:  None tried Associated symptoms: chest pain, chills, cough, headaches, myalgias and sore throat   Associated symptoms: no confusion, no congestion, no diarrhea, no dysuria, no nausea, no rash, no rhinorrhea, no somnolence and no vomiting   Chest pain:    Quality: crushing and pressure     Severity:  Moderate   Onset quality:  Gradual   Duration:  2 days   Timing:  Intermittent   Progression:  Waxing and waning   Chronicity:  New   Past Medical History:  Diagnosis Date   Apnea    Asthma    Bronchitis    Cancer ([facility])    thyroid   Cervical lymphadenopathy    Depression    mild   Generalized headaches    Heart murmur    previoulsy until nov 1996   Hyperlipidemia    Insomnia    Kidney stone 10/2010   Morbid obesity ([place])    Nocturia    Non-restorative sleep    Pneumonia    Pseudotumor cerebri    Sinus problem    Snoring    SOB (shortness of breath)    Swollen lymph nodes    Thyroid disease    Cancer    Patient Active Problem List   Diagnosis Date Noted   Paresthesia 06/16/2016   Obstructive sleep apnea (adult) (pediatric) 08/10/2012   Abdominal pain, chronic, 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 10/05/2010   Fatigue/loss of sleep 09/29/2010   Bronchitis 09/29/2010   Asthma 09/29/2010   SOB  (shortness of breath) 09/29/2010   Pneumonia 09/29/2010   Hernia 09/29/2010   Generalized headaches 09/29/2010   Swollen lymph nodes 09/29/2010   Sinus problem/runny nose 09/29/2010   Thyroid disease 09/29/2010    Past Surgical History:  Procedure Laterality Date   CESAREAN SECTION  1997, 2001   [place]   lymphnode biposy     left posterior cervical 08/27/2010   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      Home Medications    Prior to Admission medications   Medication Sig Start Date End Date Taking? Authorizing Provider  aspirin-acetaminophen-caffeine (EXCEDRIN MIGRAINE) [phone] MG tablet Take 1 tablet by mouth every 6 (six) hours as needed for headache.   Yes [provider]  ibuprofen (ADVIL,MOTRIN) 800 MG tablet Take 800 mg by mouth every 8 (eight) hours as needed.   Yes [provider]  albuterol (VENTOLIN HFA) 108 (90 BASE) MCG/ACT inhaler Inhale 2 puffs into the lungs every 4 (four) hours as needed. For wheezing and shortness of breath. 04/13/12  Bailey Mech E, PA-C  amLODipine (NORVASC) 5 MG tablet Take 5 mg by mouth daily.    [provider]  levothyroxine (SYNTHROID, LEVOTHROID) 137 MCG tablet Take 137 mcg by mouth 2 (two) times daily.    [provider]  sertraline (ZOLOFT) 50 MG tablet Take 50 mg by mouth daily. 07/21/17   [provider]  Vitamin D, Ergocalciferol, (DRISDOL) 50000 units CAPS capsule Take 50,000 Units by mouth once a week. 05/16/16   [provider]    Family History Family History  Adopted: Yes  Problem Relation Age of Onset   Bladder Cancer Mother     Social History Social History   Tobacco Use   Smoking status: Former Smoker    Types: Cigarettes    Last attempt to quit: 12/27/2010    Years  since quitting: 7.5   Smokeless tobacco: Never Used   Tobacco comment: Reached 10- 20 cigarttes daily at quitting    Substance Use Topics   Alcohol use: Yes    Comment: rare   Drug use: No     Allergies   Penicillins   Review of Systems Review of Systems  Constitutional: Positive for chills, fatigue and fever. Negative for diaphoresis.  HENT: Positive for sore throat. Negative for congestion and rhinorrhea.   Eyes: Negative for visual disturbance.  Respiratory: Positive for cough, chest tightness and shortness of breath. Negative for wheezing and stridor.   Cardiovascular: Positive for chest pain and palpitations. Negative for leg swelling.  Gastrointestinal: Positive for abdominal pain (mild epigastric). Negative for constipation, diarrhea, nausea and vomiting.  Genitourinary: Negative for dysuria, flank pain, frequency and pelvic pain.  Musculoskeletal: Positive for myalgias. Negative for back pain, neck pain and neck stiffness.  Skin: Negative for rash and wound.  Neurological: Positive for headaches. Negative for light-headedness.  Psychiatric/Behavioral: Negative for agitation and confusion.  All other systems reviewed and are negative.    Physical Exam Updated Vital Signs Pulse (!) 109    Temp 98.7 F (37.1 C)    Resp 18    Ht 5\' 4"  (1.626 m)    Wt 110.7 kg    SpO2 95%    BMI 41.88 kg/m   Physical Exam Vitals signs and nursing note reviewed.  Constitutional:      General: She is not in acute distress.    Appearance: She is well-developed. She is ill-appearing. She is not toxic-appearing or diaphoretic.  HENT:     Head: Normocephalic and atraumatic.     Nose: No congestion or rhinorrhea.     Mouth/Throat:     Mouth: Mucous membranes are moist.     Pharynx: No oropharyngeal exudate or posterior oropharyngeal erythema.  Eyes:     Extraocular Movements: Extraocular movements intact.     Conjunctiva/sclera: Conjunctivae normal.     Pupils: Pupils are equal,  round, and reactive to light.  Neck:     Musculoskeletal: Neck supple. No muscular tenderness.  Cardiovascular:     Rate and Rhythm: Regular rhythm. Tachycardia present.     Pulses: Normal pulses.     Heart sounds: No murmur.  Pulmonary:     Effort: Pulmonary effort is normal. No respiratory distress.     Breath sounds: Rhonchi present. No wheezing or rales.  Chest:     Chest wall: No tenderness.  Abdominal:     General: There is no distension.     Palpations: Abdomen is soft.     Tenderness: There is no abdominal tenderness.  Musculoskeletal:  General: No tenderness.     Right lower leg: No edema.     Left lower leg: No edema.  Skin:    General: Skin is warm and dry.     Capillary Refill: Capillary refill takes less than 2 seconds.  Neurological:     General: No focal deficit present.     Mental Status: She is alert.  Psychiatric:        Mood and Affect: Mood normal.      ED Treatments / Results  Labs (all labs ordered are listed, but only abnormal results are displayed) Labs Reviewed  CBC WITH DIFFERENTIAL/PLATELET - Abnormal; Notable for the following components:      Result Value   WBC 14.1 (*)    Neutro Abs 11.4 (*)    Monocytes Absolute 1.1 (*)    Abs Immature Granulocytes 0.08 (*)    All other components within normal limits  COMPREHENSIVE METABOLIC PANEL - Abnormal; Notable for the following components:   Potassium 3.0 (*)    Glucose, Bld 120 (*)    Calcium 8.3 (*)    Albumin 3.3 (*)    All other components within normal limits  D-DIMER, QUANTITATIVE (NOT AT [facility]) - Abnormal; Notable for the following components:   D-Dimer, Quant 0.96 (*)    All other components within normal limits  NOVEL CORONAVIRUS, NAA (HOSPITAL ORDER, SEND-OUT TO REF LAB)  LACTIC ACID, PLASMA  LIPASE, BLOOD  TROPONIN I  LACTIC ACID, PLASMA    EKG EKG Interpretation  Date/Time:  Tuesday July 03 2018 18:25:47 EDT Ventricular Rate:  95 PR Interval:    QRS  Duration: 99 QT Interval:  338 QTC Calculation: 425 R Axis:   34 Text Interpretation:  Sinus rhythm When compared to prior, less pvc.  NO STEMI Confirmed by Antony Blackbird [id number]) on 07/03/2018 6:29:26 PM   Radiology Ct Angio Chest Pe W And/or Wo Contrast  Result Date: 07/03/2018 CLINICAL DATA:  Shortness of breath, chest pain. EXAM: CT ANGIOGRAPHY CHEST WITH CONTRAST TECHNIQUE: Multidetector CT imaging of the chest was performed using the standard protocol during bolus administration of intravenous contrast. Multiplanar CT image reconstructions and MIPs were obtained to evaluate the vascular anatomy. CONTRAST:  63 mL ISOVUE-370 IOPAMIDOL (ISOVUE-370) INJECTION 76% COMPARISON:  CT scan of October 01, 2008. FINDINGS: Cardiovascular: Satisfactory opacification of the pulmonary arteries to the segmental level. No evidence of pulmonary embolism. Normal heart size. No pericardial effusion. No evidence of thoracic aortic dissection or aneurysm. Mediastinum/Nodes: No enlarged mediastinal, hilar, or axillary lymph nodes. Thyroid gland, trachea, and esophagus demonstrate no significant findings. Lungs/Pleura: Lungs are clear. No pleural effusion or pneumothorax. Upper Abdomen: No acute abnormality. Musculoskeletal: No chest wall abnormality. No acute or significant osseous findings. Review of the MIP images confirms the above findings. IMPRESSION: No definite evidence of pulmonary embolus. No acute abnormality seen in the chest. Electronically Signed   By: Marijo Conception, M.D.   On: 07/03/2018 20:05   Dg Chest Portable 1 View  Result Date: 07/03/2018 CLINICAL DATA:  48 year old female with fever and chills, shortness of breath and body aches since yesterday. EXAM: PORTABLE CHEST 1 VIEW COMPARISON:  12/28/2010. FINDINGS: Portable AP semi upright view at 1824 hours. Low lung volumes but no confluent pulmonary opacity. No pneumothorax or pleural effusion. Mildly increased interstitial markings. Normal cardiac size  and mediastinal contours. Visualized tracheal air column is within normal limits. Paucity of bowel gas. No osseous abnormality identified. IMPRESSION: Low lung volumes with increased interstitial markings which  could reflect atelectasis but viral/atypical respiratory infection is difficult to exclude. Electronically Signed   By: Genevie Ann M.D.   On: 07/03/2018 19:34    Procedures Procedures (including critical care time)  Medications Ordered in ED Medications  sodium chloride 0.9 % bolus 1,000 mL ( Intravenous Stopped 07/03/18 2000)  potassium chloride SA (K-DUR,KLOR-CON) CR tablet 40 mEq (40 mEq Oral Given 07/03/18 1900)  iopamidol (ISOVUE-370) 76 % injection 100 mL (100 mLs Intravenous Contrast Given 07/03/18 1917)     Initial Impression / Assessment and Plan / ED Course  I have reviewed the triage vital signs and the nursing notes.  Pertinent labs & imaging results that were available during my care of the patient were reviewed by me and considered in my medical decision making (see chart for details).        Michelle Green is a 48 y.o. female with a past medical history significant for asthma, prior thyroid cancer status post surgery, hyperlipidemia, depression, and sleep apnea who presents with fevers, chills, cough, shortness of breath, chest pain, and malaise.  Patient reports that she began having symptoms yesterday and they have worsened.  She reports she spoke to a telehealth physician who told her to likely self quarantine.  She reports that her symptoms worsened today with chest pain shortness of breath and shows she comes to be evaluated.  She reports that she is feeling her heart racing and is tachycardic, her heart rate is around 110-120 on initial evaluation.  She is now having pleuritic chest pain that is up to 8 out of 10 in severity across her chest.  She reports it is worsened with cough as well.  She is unsure of exertional symptoms.  She denies nausea vomiting constipation,  or diarrhea.  She reports she had some urinary symptoms last week and took Azo and has had no urinary symptoms since.  She reports has had fever up to 102.5 since yesterday and is treating it with antipyretics successfully.  She reports he is feeling malaise and has some mild headache.  She denies other complaints.  She denies any history of DVT or PE or recent travel.  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  She suspects that some of them may have traveled to coronavirus areas such as [place] or [place] recently.  On initial valuation she is tachycardic and her oxygen is around 92% on room air.  Her respiratory rates are in the 20s and she is not hypotensive.  On exam, patient has some rhonchi bilaterally.  Chest is nontender to palpation.  Murmur was not appreciated.  Abdomen nontender.  Legs not edematous and nontender.  Patient will have work-up to look for multiple etiologies of her symptoms including a infectious source with pneumonia as well as cardiac and pulmonary.  Patient will have a d-dimer as she is having pleuritic chest pain and tachycardia and shortness of breath.  She will have a portable chest x-ray to start.  She will have troponin and other labs.  She will given fluids for tachycardia.  Anticipate discussions of disposition after work-up is completed and she does not ambulatory pulse oximetry measurement.  6:51 PM Labs began to return showing patient does have a leukocytosis.  Patient's potassium was low, oral potassium supplementation ordered.  D-dimer was elevated, will order CT PE study to better evaluate the lungs and to rule out PE.  Anticipate reassessment.  8:33 PM Patient's diagnostic work-up  is returned.  CT scan does not show evidence of pulmonary ballismus and does not show evidence of pneumonia.  X-ray showed possible atypical viral infection.  This fits with the patient's concerning story for viral infection  or even coronavirus given possible exposure.  Patient's ambulatory pulse ox testing showed normal oxygen saturations and her sats are now in the upper 90s on room air.  Had a shared decision made conversation with patient and she agrees to go home.  Patient will have a coronavirus test sent given the possible exposure and symptoms.  Patient instructed to self isolate/quarantine for the next 14 days and will follow-up on the coronavirus test.  Patient was given extremely strict return precautions for any worsening symptoms or hypoxia.  Patient understands the plan of care and has no other questions or concerns.  Patient will be discharged for outpatient monitoring and management.  Final Clinical Impressions(s) / ED Diagnoses   Final diagnoses:  Cough  Fever, unspecified fever cause  Chest pain, unspecified type  Upper respiratory tract infection, unspecified type    ED Discharge Orders    None     Clinical Impression: 1. Cough   2. Fever, unspecified fever cause   3. Chest pain, unspecified type   4. Upper respiratory tract infection, unspecified type     Disposition: Discharge  Condition: Good  I have discussed the results, Dx and Tx plan with the pt(& family if present). He/she/they expressed understanding and agree(s) with the plan. Discharge instructions discussed at great length. Strict return precautions discussed and pt &/or family have verbalized understanding of the instructions. No further questions at time of discharge.    Discharge Medication List as of 07/03/2018  8:36 PM      Follow Up: Seward Carol, MD [address] [place] [address] [facility] [address] [id number] DI [facility]ME[facility] [place] [phone]       Timber Marshman, Gwenyth Allegra, MD 07/03/18 2136

## 2018-07-03 NOTE — Discharge Instructions (Signed)
Person Under Monitoring Name: Michelle Green  Location: [address] [place] [address]   Infection Prevention Recommendations for Individuals Confirmed to have, or Being Evaluated for, 2019 Novel Coronavirus (COVID-19) Infection Who Receive Care at Home  Individuals who are confirmed to have, or are being evaluated for, COVID-19 should follow the prevention steps below until a healthcare provider or local or state health department says they can return to normal activities.  Stay home except to get medical care You should restrict activities outside your home, except for getting medical care. Do not go to work, school, or public areas, and do not use public transportation or taxis.  Call ahead before visiting your doctor Before your medical appointment, call the healthcare provider and tell them that you have, or are being evaluated for, COVID-19 infection. This will help the healthcare providers office take steps to keep other people from getting infected. Ask your healthcare provider to call the local or state health department.  Monitor your symptoms Seek prompt medical attention if your illness is worsening (e.g., difficulty breathing). Before going to your medical appointment, call the healthcare provider and tell them that you have, or are being evaluated for, COVID-19 infection. Ask your healthcare provider to call the local or state health department.  Wear a facemask You should wear a facemask that covers your nose and mouth when you are in the same room with other people and when you visit a healthcare provider. People who live with or visit you should also wear a facemask while they are in the same room with you.  Separate yourself from other people in your home As much as possible, you should stay in a different room from other people in your home. Also, you should use a separate bathroom, if available.  Avoid sharing household items You should not  share dishes, drinking glasses, cups, eating utensils, towels, bedding, or other items with other people in your home. After using these items, you should wash them thoroughly with soap and water.  Cover your coughs and sneezes Cover your mouth and nose with a tissue when you cough or sneeze, or you can cough or sneeze into your sleeve. Throw used tissues in a lined trash can, and immediately wash your hands with soap and water for at least 20 seconds or use an alcohol-based hand rub.  Wash your [facility] your hands often and thoroughly with soap and water for at least 20 seconds. You can use an alcohol-based hand sanitizer if soap and water are not available and if your hands are not visibly dirty. Avoid touching your eyes, nose, and mouth with unwashed hands.   Prevention Steps for Caregivers and Household Members of Individuals Confirmed to have, or Being Evaluated for, COVID-19 Infection Being Cared for in the Home  If you live with, or provide care at home for, a person confirmed to have, or being evaluated for, COVID-19 infection please follow these guidelines to prevent infection:  Follow healthcare providers instructions Make sure that you understand and can help the patient follow any healthcare provider instructions for all care.  Provide for the patients basic needs You should help the patient with basic needs in the home and provide support for getting groceries, prescriptions, and other personal needs.  Monitor the patients symptoms If they are getting sicker, call his or her medical provider and tell them that the patient has, or is being evaluated for, COVID-19 infection. This will help the healthcare providers office  take steps to keep other people from getting infected. Ask the healthcare provider to call the local or state health department.  Limit the number of people who have contact with the patient If possible, have only one caregiver for the  patient. Other household members should stay in another home or place of residence. If this is not possible, they should stay in another room, or be separated from the patient as much as possible. Use a separate bathroom, if available. Restrict visitors who do not have an essential need to be in the home.  Keep older adults, very [place] children, and other sick people away from the patient Keep older adults, very [place] children, and those who have compromised immune systems or chronic health conditions away from the patient. This includes people with chronic heart, lung, or kidney conditions, diabetes, and cancer.  Ensure good ventilation Make sure that shared spaces in the home have good air flow, such as from an air conditioner or an opened window, weather permitting.  Wash your hands often Wash your hands often and thoroughly with soap and water for at least 20 seconds. You can use an alcohol based hand sanitizer if soap and water are not available and if your hands are not visibly dirty. Avoid touching your eyes, nose, and mouth with unwashed hands. Use disposable paper towels to dry your hands. If not available, use dedicated cloth towels and replace them when they become wet.  Wear a facemask and gloves Wear a disposable facemask at all times in the room and gloves when you touch or have contact with the patients blood, body fluids, and/or secretions or excretions, such as sweat, saliva, sputum, nasal mucus, vomit, urine, or feces.  Ensure the mask fits over your nose and mouth tightly, and do not touch it during use. Throw out disposable facemasks and gloves after using them. Do not reuse. Wash your hands immediately after removing your facemask and gloves. If your personal clothing becomes contaminated, carefully remove clothing and launder. Wash your hands after handling contaminated clothing. Place all used disposable facemasks, gloves, and other waste in a lined container before  disposing them with other household waste. Remove gloves and wash your hands immediately after handling these items.  Do not share dishes, glasses, or other household items with the patient Avoid sharing household items. You should not share dishes, drinking glasses, cups, eating utensils, towels, bedding, or other items with a patient who is confirmed to have, or being evaluated for, COVID-19 infection. After the person uses these items, you should wash them thoroughly with soap and water.  Wash laundry thoroughly Immediately remove and wash clothes or bedding that have blood, body fluids, and/or secretions or excretions, such as sweat, saliva, sputum, nasal mucus, vomit, urine, or feces, on them. Wear gloves when handling laundry from the patient. Read and follow directions on labels of laundry or clothing items and detergent. In general, wash and dry with the warmest temperatures recommended on the label.  Clean all areas the individual has used often Clean all touchable surfaces, such as counters, tabletops, doorknobs, bathroom fixtures, toilets, phones, keyboards, tablets, and bedside tables, every day. Also, clean any surfaces that may have blood, body fluids, and/or secretions or excretions on them. Wear gloves when cleaning surfaces the patient has come in contact with. Use a diluted bleach solution (e.g., dilute bleach with 1 part bleach and 10 parts water) or a household disinfectant with a label that says EPA-registered for coronaviruses. To make a bleach  solution at home, add 1 tablespoon of bleach to 1 quart (4 cups) of water. For a larger supply, add  cup of bleach to 1 gallon (16 cups) of water. Read labels of cleaning products and follow recommendations provided on product labels. Labels contain instructions for safe and effective use of the cleaning product including precautions you should take when applying the product, such as wearing gloves or eye protection and making sure you  have good ventilation during use of the product. Remove gloves and wash hands immediately after cleaning.  Monitor yourself for signs and symptoms of illness Caregivers and household members are considered close contacts, should monitor their health, and will be asked to limit movement outside of the home to the extent possible. Follow the monitoring steps for close contacts listed on the symptom monitoring form.   ? If you have additional questions, contact your local health department or call the epidemiologist on call at [phone] (available 24/7). ? This guidance is subject to change. For the most up-to-date guidance from [facility], please refer to their website: [email or web address]

## 2018-07-03 NOTE — ED Notes (Signed)
Pulse oximeter while ambulating O2 of 98%; HR of 110

## 2018-07-03 NOTE — ED Triage Notes (Signed)
Pt sent here by PMD for eval, fever 102.5  and chills started yesterday, SOb , body aches, h/a  and chest pain with deep breathing today

## 2018-07-07 LAB — NOVEL CORONAVIRUS, NAA (HOSPITAL ORDER, SEND-OUT TO REF LAB): SARS-COV-2, NAA: NOT DETECTED

## 2018-07-19 DIAGNOSIS — E876 Hypokalemia: Secondary | ICD-10-CM | POA: Diagnosis not present

## 2018-07-19 DIAGNOSIS — I10 Essential (primary) hypertension: Secondary | ICD-10-CM | POA: Diagnosis not present

## 2018-07-19 DIAGNOSIS — E7849 Other hyperlipidemia: Secondary | ICD-10-CM | POA: Diagnosis not present

## 2018-07-19 DIAGNOSIS — E890 Postprocedural hypothyroidism: Secondary | ICD-10-CM | POA: Diagnosis not present

## 2018-07-19 DIAGNOSIS — R002 Palpitations: Secondary | ICD-10-CM | POA: Diagnosis not present

## 2018-07-19 DIAGNOSIS — C73 Malignant neoplasm of thyroid gland: Secondary | ICD-10-CM | POA: Diagnosis not present

## 2018-10-25 DIAGNOSIS — S83242A Other tear of medial meniscus, current injury, left knee, initial encounter: Secondary | ICD-10-CM | POA: Diagnosis not present

## 2019-01-17 DIAGNOSIS — N200 Calculus of kidney: Secondary | ICD-10-CM | POA: Diagnosis not present

## 2019-01-17 DIAGNOSIS — C73 Malignant neoplasm of thyroid gland: Secondary | ICD-10-CM | POA: Diagnosis not present

## 2019-01-17 DIAGNOSIS — E890 Postprocedural hypothyroidism: Secondary | ICD-10-CM | POA: Diagnosis not present

## 2019-01-17 DIAGNOSIS — R002 Palpitations: Secondary | ICD-10-CM | POA: Diagnosis not present

## 2019-01-21 ENCOUNTER — Other Ambulatory Visit: Payer: Self-pay | Admitting: *Deleted

## 2019-01-21 DIAGNOSIS — Z20828 Contact with and (suspected) exposure to other viral communicable diseases: Secondary | ICD-10-CM | POA: Diagnosis not present

## 2019-01-21 DIAGNOSIS — Z20822 Contact with and (suspected) exposure to covid-19: Secondary | ICD-10-CM

## 2019-01-22 LAB — NOVEL CORONAVIRUS, NAA: SARS-CoV-2, NAA: NOT DETECTED

## 2019-01-24 DIAGNOSIS — J020 Streptococcal pharyngitis: Secondary | ICD-10-CM | POA: Diagnosis not present

## 2019-01-29 ENCOUNTER — Other Ambulatory Visit: Payer: Self-pay

## 2019-01-29 DIAGNOSIS — Z20822 Contact with and (suspected) exposure to covid-19: Secondary | ICD-10-CM

## 2019-01-30 LAB — NOVEL CORONAVIRUS, NAA: SARS-CoV-2, NAA: NOT DETECTED

## 2019-02-27 DIAGNOSIS — Z6841 Body Mass Index (BMI) 40.0 and over, adult: Secondary | ICD-10-CM | POA: Diagnosis not present

## 2019-02-27 DIAGNOSIS — Z01419 Encounter for gynecological examination (general) (routine) without abnormal findings: Secondary | ICD-10-CM | POA: Diagnosis not present

## 2019-02-27 DIAGNOSIS — R300 Dysuria: Secondary | ICD-10-CM | POA: Diagnosis not present

## 2019-04-01 DIAGNOSIS — G5621 Lesion of ulnar nerve, right upper limb: Secondary | ICD-10-CM | POA: Diagnosis not present

## 2019-04-01 DIAGNOSIS — G5622 Lesion of ulnar nerve, left upper limb: Secondary | ICD-10-CM | POA: Diagnosis not present

## 2019-04-06 DIAGNOSIS — Z20828 Contact with and (suspected) exposure to other viral communicable diseases: Secondary | ICD-10-CM | POA: Diagnosis not present

## 2019-04-06 DIAGNOSIS — Z03818 Encounter for observation for suspected exposure to other biological agents ruled out: Secondary | ICD-10-CM | POA: Diagnosis not present

## 2019-04-19 DIAGNOSIS — M79642 Pain in left hand: Secondary | ICD-10-CM | POA: Diagnosis not present

## 2019-04-19 DIAGNOSIS — M79641 Pain in right hand: Secondary | ICD-10-CM | POA: Diagnosis not present

## 2019-04-19 DIAGNOSIS — R200 Anesthesia of skin: Secondary | ICD-10-CM | POA: Diagnosis not present

## 2019-04-25 DIAGNOSIS — G5621 Lesion of ulnar nerve, right upper limb: Secondary | ICD-10-CM | POA: Diagnosis not present

## 2019-04-25 DIAGNOSIS — G5622 Lesion of ulnar nerve, left upper limb: Secondary | ICD-10-CM | POA: Diagnosis not present

## 2019-05-01 DIAGNOSIS — G5621 Lesion of ulnar nerve, right upper limb: Secondary | ICD-10-CM | POA: Diagnosis not present

## 2019-05-01 DIAGNOSIS — Z6841 Body Mass Index (BMI) 40.0 and over, adult: Secondary | ICD-10-CM | POA: Diagnosis not present

## 2019-05-01 DIAGNOSIS — I10 Essential (primary) hypertension: Secondary | ICD-10-CM | POA: Diagnosis not present

## 2019-05-14 DIAGNOSIS — G5621 Lesion of ulnar nerve, right upper limb: Secondary | ICD-10-CM | POA: Diagnosis not present

## 2019-06-26 DIAGNOSIS — M1711 Unilateral primary osteoarthritis, right knee: Secondary | ICD-10-CM | POA: Diagnosis not present

## 2019-07-16 DIAGNOSIS — C73 Malignant neoplasm of thyroid gland: Secondary | ICD-10-CM | POA: Diagnosis not present

## 2019-07-16 DIAGNOSIS — N200 Calculus of kidney: Secondary | ICD-10-CM | POA: Diagnosis not present

## 2019-07-16 DIAGNOSIS — E890 Postprocedural hypothyroidism: Secondary | ICD-10-CM | POA: Diagnosis not present

## 2019-07-17 DIAGNOSIS — Z23 Encounter for immunization: Secondary | ICD-10-CM | POA: Diagnosis not present

## 2019-08-14 DIAGNOSIS — Z23 Encounter for immunization: Secondary | ICD-10-CM | POA: Diagnosis not present

## 2019-12-02 DIAGNOSIS — R0682 Tachypnea, not elsewhere classified: Secondary | ICD-10-CM | POA: Diagnosis not present

## 2019-12-02 DIAGNOSIS — R519 Headache, unspecified: Secondary | ICD-10-CM | POA: Diagnosis not present

## 2019-12-02 DIAGNOSIS — R918 Other nonspecific abnormal finding of lung field: Secondary | ICD-10-CM | POA: Diagnosis not present

## 2019-12-02 DIAGNOSIS — B9789 Other viral agents as the cause of diseases classified elsewhere: Secondary | ICD-10-CM | POA: Diagnosis not present

## 2019-12-02 DIAGNOSIS — J984 Other disorders of lung: Secondary | ICD-10-CM | POA: Diagnosis not present

## 2019-12-02 DIAGNOSIS — Z03818 Encounter for observation for suspected exposure to other biological agents ruled out: Secondary | ICD-10-CM | POA: Diagnosis not present

## 2019-12-02 DIAGNOSIS — Z20822 Contact with and (suspected) exposure to covid-19: Secondary | ICD-10-CM | POA: Diagnosis not present

## 2019-12-02 DIAGNOSIS — R509 Fever, unspecified: Secondary | ICD-10-CM | POA: Diagnosis not present

## 2019-12-02 DIAGNOSIS — J069 Acute upper respiratory infection, unspecified: Secondary | ICD-10-CM | POA: Diagnosis not present

## 2019-12-02 DIAGNOSIS — R05 Cough: Secondary | ICD-10-CM | POA: Diagnosis not present

## 2019-12-02 DIAGNOSIS — R0602 Shortness of breath: Secondary | ICD-10-CM | POA: Diagnosis not present

## 2019-12-02 DIAGNOSIS — R0600 Dyspnea, unspecified: Secondary | ICD-10-CM | POA: Diagnosis not present

## 2019-12-02 DIAGNOSIS — Z880 Allergy status to penicillin: Secondary | ICD-10-CM | POA: Diagnosis not present

## 2019-12-03 DIAGNOSIS — J069 Acute upper respiratory infection, unspecified: Secondary | ICD-10-CM | POA: Diagnosis not present

## 2019-12-30 DIAGNOSIS — I10 Essential (primary) hypertension: Secondary | ICD-10-CM | POA: Diagnosis not present

## 2019-12-30 DIAGNOSIS — C73 Malignant neoplasm of thyroid gland: Secondary | ICD-10-CM | POA: Diagnosis not present

## 2019-12-30 DIAGNOSIS — E785 Hyperlipidemia, unspecified: Secondary | ICD-10-CM | POA: Diagnosis not present

## 2019-12-30 DIAGNOSIS — Z8632 Personal history of gestational diabetes: Secondary | ICD-10-CM | POA: Diagnosis not present

## 2019-12-30 DIAGNOSIS — E890 Postprocedural hypothyroidism: Secondary | ICD-10-CM | POA: Diagnosis not present

## 2020-01-07 DIAGNOSIS — E1165 Type 2 diabetes mellitus with hyperglycemia: Secondary | ICD-10-CM | POA: Diagnosis not present

## 2020-01-20 ENCOUNTER — Other Ambulatory Visit: Payer: Self-pay | Admitting: Obstetrics and Gynecology

## 2020-01-20 DIAGNOSIS — Z1231 Encounter for screening mammogram for malignant neoplasm of breast: Secondary | ICD-10-CM

## 2020-01-30 DIAGNOSIS — M1711 Unilateral primary osteoarthritis, right knee: Secondary | ICD-10-CM | POA: Diagnosis not present

## 2020-02-05 DIAGNOSIS — G5622 Lesion of ulnar nerve, left upper limb: Secondary | ICD-10-CM | POA: Diagnosis not present

## 2020-02-06 DIAGNOSIS — M25561 Pain in right knee: Secondary | ICD-10-CM | POA: Diagnosis not present

## 2020-02-11 DIAGNOSIS — M1711 Unilateral primary osteoarthritis, right knee: Secondary | ICD-10-CM | POA: Diagnosis not present

## 2020-02-20 DIAGNOSIS — E1165 Type 2 diabetes mellitus with hyperglycemia: Secondary | ICD-10-CM | POA: Diagnosis not present

## 2020-02-28 DIAGNOSIS — G5622 Lesion of ulnar nerve, left upper limb: Secondary | ICD-10-CM | POA: Diagnosis not present

## 2020-03-09 ENCOUNTER — Other Ambulatory Visit: Payer: Self-pay

## 2020-03-09 ENCOUNTER — Ambulatory Visit
Admission: RE | Admit: 2020-03-09 | Discharge: 2020-03-09 | Disposition: A | Discharge status: Home / Self Care | Payer: BC Managed Care – PPO | Source: Ambulatory Visit | Attending: Obstetrics and Gynecology | Admitting: Obstetrics and Gynecology

## 2020-03-09 DIAGNOSIS — Z1231 Encounter for screening mammogram for malignant neoplasm of breast: Secondary | ICD-10-CM

## 2020-04-06 ENCOUNTER — Ambulatory Visit: Payer: BC Managed Care – PPO

## 2020-05-13 ENCOUNTER — Ambulatory Visit: Payer: Self-pay

## 2021-12-02 ENCOUNTER — Telehealth: Payer: BLUE CROSS/BLUE SHIELD | Admitting: Physician Assistant

## 2021-12-02 DIAGNOSIS — R3989 Other symptoms and signs involving the genitourinary system: Secondary | ICD-10-CM

## 2021-12-02 MED ORDER — SULFAMETHOXAZOLE-TRIMETHOPRIM 800-160 MG PO TABS: 1.0000 | ORAL_TABLET | Freq: Two times a day (BID) | ORAL | 0 refills | Status: AC

## 2021-12-02 NOTE — Progress Notes (Signed)
Virtual Visit Consent   Heide Scales, you are scheduled for a virtual visit with a [facility] provider today. Just as with appointments in the office, your consent must be obtained to participate. Your consent will be active for this visit and any virtual visit you may have with one of our providers in the next 365 days. If you have a MyChart account, a copy of this consent can be sent to you electronically.  As this is a virtual visit, video technology does not allow for your provider to perform a traditional examination. This may limit your provider's ability to fully assess your condition. If your provider identifies any concerns that need to be evaluated in person or the need to arrange testing (such as labs, EKG, etc.), we will make arrangements to do so. Although advances in technology are sophisticated, we cannot ensure that it will always work on either your end or our end. If the connection with a video visit is poor, the visit may have to be switched to a telephone visit. With either a video or telephone visit, we are not always able to ensure that we have a secure connection.  By engaging in this virtual visit, you consent to the provision of healthcare and authorize for your insurance to be billed (if applicable) for the services provided during this visit. Depending on your insurance coverage, you may receive a charge related to this service.  I need to obtain your verbal consent now. Are you willing to proceed with your visit today? Michelle Green has provided verbal consent on 12/02/2021 for a virtual visit (video or telephone). Leeanne Rio, [place]  Date: 12/02/2021 6:07 PM  Virtual Visit via Video Note   I, Leeanne Rio, connected with  Michelle Green  ([id number], 1971/02/09) on 12/02/21 at  6:00 PM EDT by a video-enabled telemedicine application and verified that I am speaking with the correct person using two identifiers.  Location: Patient: Virtual Visit Location  Patient: Home Provider: Virtual Visit Location Provider: Home Office   I discussed the limitations of evaluation and management by telemedicine and the availability of in person appointments. The patient expressed understanding and agreed to proceed.    History of Present Illness: Michelle Green is a 51 y.o. who identifies as a female who was assigned female at birth, and is being seen today for possible UTI. Notes symptoms starting yesterday with malaise and some urinary changes. Today noting increased dysuria, urgency, frequency. Notes some L low back pain. Denies nausea, vomiting, fever, chills. Denies flank pain. Denies vaginal pressure or pain.   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 Not indicated on history form dated 08/16/10. Other reaction(s): Hives   Medications:  Current Outpatient Medications:    sulfamethoxazole-trimethoprim (BACTRIM DS) 800-160 MG tablet, Take 1 tablet by mouth 2 (two) times daily.,  Disp: 14 tablet, Rfl: 0   albuterol (VENTOLIN HFA) 108 (90 BASE) MCG/ACT inhaler, Inhale 2 puffs into the lungs every 4 (four) hours as needed. For wheezing and shortness of breath., Disp: 6.7 g, Rfl: 0   amLODipine (NORVASC) 5 MG tablet, Take 5 mg by mouth daily., Disp: , Rfl:    levothyroxine (SYNTHROID, LEVOTHROID) 137 MCG  tablet, Take 137 mcg by mouth 2 (two) times daily., Disp: , Rfl:   Observations/Objective: Patient is well-developed, well-nourished in no acute distress.  Resting comfortably at home.  Head is normocephalic, atraumatic.  No labored breathing. Speech is clear and coherent with logical content.  Patient is alert and oriented at baseline.   Assessment and Plan: 1. Suspected UTI - sulfamethoxazole-trimethoprim (BACTRIM DS) 800-160 MG tablet; Take 1 tablet by mouth 2 (two) times daily.  Dispense: 14 tablet; Refill: 0  Classic UTI symptoms with absence of alarm signs or symptoms. Remote history of UTI. Will treat empirically with Bactrim for suspected uncomplicated cystitis. Supportive measures and OTC medications reviewed. Strict in-person evaluation precautions discussed.    Follow Up Instructions: I discussed the assessment and treatment plan with the patient. The patient was provided an opportunity to ask questions and all were answered. The patient agreed with the plan and demonstrated an understanding of the instructions.  A copy of instructions were sent to the patient via MyChart unless otherwise noted below.   The patient was advised to call back or seek an in-person evaluation if the symptoms worsen or if the condition fails to improve as anticipated.  Time:  I spent 10 minutes with the patient via telehealth technology discussing the above problems/concerns.    Leeanne Rio, PA-C

## 2021-12-02 NOTE — Patient Instructions (Addendum)
Heide Scales, thank you for joining Leeanne Rio, PA-C for today's virtual visit.  While this provider is not your primary care provider (PCP), if your PCP is located in our provider database this encounter information will be shared with them immediately following your visit.  Consent: (Patient) Armen Pickup Olazabal provided verbal consent for this virtual visit at the beginning of the encounter.  Current Medications:  Current Outpatient Medications:    albuterol (VENTOLIN HFA) 108 (90 BASE) MCG/ACT inhaler, Inhale 2 puffs into the lungs every 4 (four) hours as needed. For wheezing and shortness of breath., Disp: 6.7 g, Rfl: 0   amLODipine (NORVASC) 5 MG tablet, Take 5 mg by mouth daily., Disp: , Rfl:    aspirin-acetaminophen-caffeine (EXCEDRIN MIGRAINE) 250-250-65 MG tablet, Take 1 tablet by mouth every 6 (six) hours as needed for headache., Disp: , Rfl:    ibuprofen (ADVIL,MOTRIN) 800 MG tablet, Take 800 mg by mouth every 8 (eight) hours as needed., Disp: , Rfl:    levothyroxine (SYNTHROID, LEVOTHROID) 137 MCG tablet, Take 137 mcg by mouth 2 (two) times daily., Disp: , Rfl:    sertraline (ZOLOFT) 50 MG tablet, Take 50 mg by mouth daily., Disp: , Rfl: 10   Vitamin D, Ergocalciferol, (DRISDOL) 50000 units CAPS capsule, Take 50,000 Units by mouth once a week., Disp: , Rfl: 6   Medications ordered in this encounter:  No orders of the defined types were placed in this encounter.    *If you need refills on other medications prior to your next appointment, please contact your pharmacy*  Follow-Up: Call back or seek an in-person evaluation if the symptoms worsen or if the condition fails to improve as anticipated.  Other Instructions Your symptoms are consistent with a bladder infection, also called acute cystitis. Please take your antibiotic (Bactrim) as directed until all pills are gone.  Stay very well hydrated.  Consider a daily probiotic (Align, Culturelle, or Activia) to help  prevent stomach upset caused by the antibiotic.  Taking a probiotic daily may also help prevent recurrent UTIs.  Also consider taking AZO (Phenazopyridine) tablets to help decrease pain with urination.   Urinary Tract Infection A urinary tract infection (UTI) can occur any place along the urinary tract. The tract includes the kidneys, ureters, bladder, and urethra. A type of germ called bacteria often causes a UTI. UTIs are often helped with antibiotic medicine.  HOME CARE  If given, take antibiotics as told by your doctor. Finish them even if you start to feel better. Drink enough fluids to keep your pee (urine) clear or pale yellow. Avoid tea, drinks with caffeine, and bubbly (carbonated) drinks. Pee often. Avoid holding your pee in for a long time. Pee before and after having sex (intercourse). Wipe from front to back after you poop (bowel movement) if you are a woman. Use each tissue only once. GET HELP RIGHT AWAY IF:  You have back pain. You have lower belly (abdominal) pain. You have chills. You feel sick to your stomach (nauseous). You throw up (vomit). Your burning or discomfort with peeing does not go away. You have a fever. Your symptoms are not better in 3 days. MAKE SURE YOU:  Understand these instructions. Will watch your condition. Will get help right away if you are not doing well or get worse. Document Released: 09/14/2007 Document Revised: 12/21/2011 Document Reviewed: 10/27/2011 [facility] Patient Information 2015 [employer], [place]. This information is not intended to replace advice given to you by your health care provider. Make  sure you discuss any questions you have with your health care provider.    If you have been instructed to have an in-person evaluation today at a local Urgent Care facility, please use the link below. It will take you to a list of all of our available [facility], including address, phone number and hours of operation. Please do not  delay care.  [facility]  If you or a family member do not have a primary care provider, use the link below to schedule a visit and establish care. When you choose a [facility] primary care physician or advanced practice provider, you gain a long-term partner in health. Find a Primary Care Provider  Learn more about [facility] in-office and virtual care options: [facility]

## 2022-09-15 ENCOUNTER — Other Ambulatory Visit: Payer: Self-pay | Admitting: Obstetrics and Gynecology

## 2022-09-15 DIAGNOSIS — R928 Other abnormal and inconclusive findings on diagnostic imaging of breast: Secondary | ICD-10-CM

## 2022-09-27 ENCOUNTER — Ambulatory Visit
Admission: RE | Admit: 2022-09-27 | Discharge: 2022-09-27 | Disposition: A | Discharge status: Home / Self Care | Payer: BLUE CROSS/BLUE SHIELD | Source: Ambulatory Visit | Attending: Obstetrics and Gynecology | Admitting: Obstetrics and Gynecology

## 2022-09-27 DIAGNOSIS — R928 Other abnormal and inconclusive findings on diagnostic imaging of breast: Secondary | ICD-10-CM

## 2022-09-28 ENCOUNTER — Other Ambulatory Visit: Payer: Self-pay | Admitting: Obstetrics and Gynecology

## 2022-09-28 DIAGNOSIS — R921 Mammographic calcification found on diagnostic imaging of breast: Secondary | ICD-10-CM

## 2022-10-04 ENCOUNTER — Ambulatory Visit
Admission: RE | Admit: 2022-10-04 | Discharge: 2022-10-04 | Disposition: A | Discharge status: Home / Self Care | Payer: BLUE CROSS/BLUE SHIELD | Source: Ambulatory Visit | Attending: Obstetrics and Gynecology | Admitting: Obstetrics and Gynecology

## 2022-10-04 DIAGNOSIS — R921 Mammographic calcification found on diagnostic imaging of breast: Secondary | ICD-10-CM

## 2022-10-04 HISTORY — PX: BREAST BIOPSY: SHX20

## 2023-02-24 ENCOUNTER — Telehealth: Payer: BLUE CROSS/BLUE SHIELD | Admitting: Family Medicine

## 2023-02-24 DIAGNOSIS — J069 Acute upper respiratory infection, unspecified: Secondary | ICD-10-CM | POA: Diagnosis not present

## 2023-02-24 MED ORDER — AZITHROMYCIN 250 MG PO TABS: ORAL_TABLET | ORAL | 0 refills | Status: AC

## 2023-02-24 MED ORDER — BENZONATATE 200 MG PO CAPS: 200.0000 mg | ORAL_CAPSULE | Freq: Two times a day (BID) | ORAL | 0 refills | Status: AC | PRN

## 2023-02-24 NOTE — Progress Notes (Signed)

## 2024-01-03 ENCOUNTER — Other Ambulatory Visit: Payer: Self-pay | Admitting: Medical Genetics
